# Patient Record
Sex: Female | Born: 1940 | Race: White | Hispanic: No | Smoking: Never smoker
Health system: Southern US, Community
[De-identification: ages and names within clinical notes are randomized; demographics above are authoritative.]

## PROBLEM LIST (undated history)

## (undated) DIAGNOSIS — D649 Anemia, unspecified: Secondary | ICD-10-CM

## (undated) DIAGNOSIS — I10 Essential (primary) hypertension: Secondary | ICD-10-CM

## (undated) DIAGNOSIS — F0390 Unspecified dementia without behavioral disturbance: Secondary | ICD-10-CM

## (undated) HISTORY — PX: VAGINAL HYSTERECTOMY: SHX2639

## (undated) HISTORY — PX: ABDOMINAL HYSTERECTOMY: SHX81

---

## 2016-06-10 ENCOUNTER — Other Ambulatory Visit: Payer: Self-pay | Admitting: Neurology

## 2016-06-10 DIAGNOSIS — R413 Other amnesia: Secondary | ICD-10-CM

## 2016-06-24 ENCOUNTER — Ambulatory Visit
Admission: RE | Admit: 2016-06-24 | Discharge: 2016-06-24 | Disposition: A | Discharge status: Home / Self Care | Payer: Medicare HMO | Source: Ambulatory Visit | Attending: Neurology | Admitting: Neurology

## 2016-06-24 ENCOUNTER — Encounter: Payer: Self-pay | Admitting: Radiology

## 2016-06-24 DIAGNOSIS — R9082 White matter disease, unspecified: Secondary | ICD-10-CM | POA: Insufficient documentation

## 2016-06-24 DIAGNOSIS — I517 Cardiomegaly: Secondary | ICD-10-CM | POA: Insufficient documentation

## 2016-06-24 DIAGNOSIS — R413 Other amnesia: Secondary | ICD-10-CM | POA: Insufficient documentation

## 2018-09-03 IMAGING — MR MR HEAD W/O CM
8 series · 48 of 48 positions shown · non-contrast
Comparison: None.

CLINICAL DATA: Memory loss.

EXAM:
MRI HEAD WITHOUT CONTRAST
TECHNIQUE: Multiplanar, multiecho pulse sequences of the brain and surrounding
structures were obtained without intravenous contrast.

[Series 2: T1 · sagittal · 5.0mm · 0.45mm/px · 3 of 23 slices shown (1 of 2)]
[im 1/23]
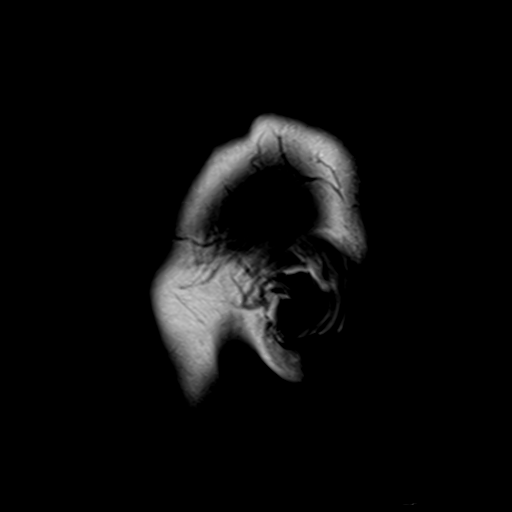
[im 12/23]
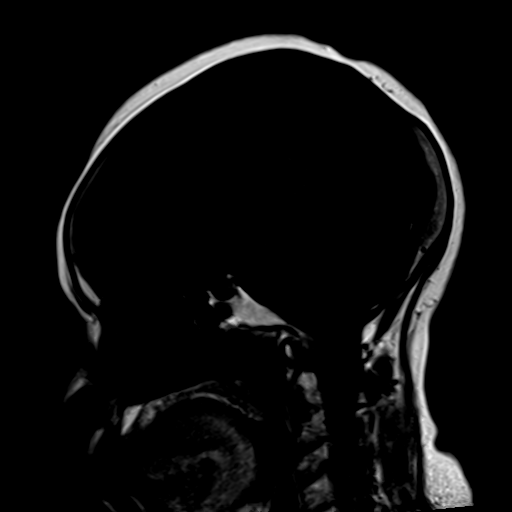
[im 23/23]
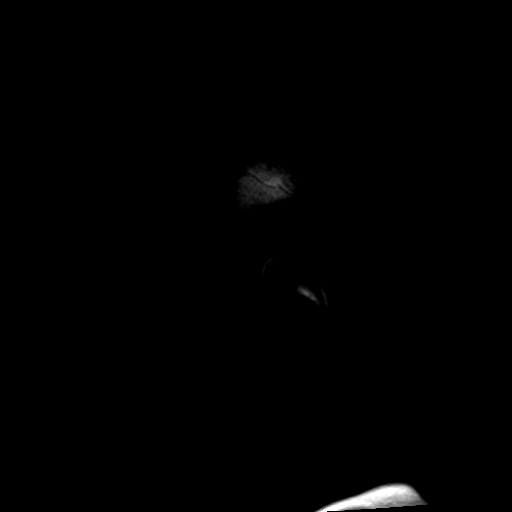

[Series 4: DWI · axial · 3.0mm · 1.20mm/px · z∈[-71,+87]mm · 6 of 55 slices shown (1 of 2)]
[im 1/55]
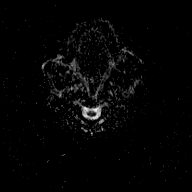
[im 11/55]
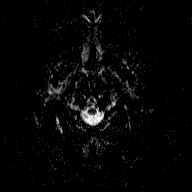
[im 22/55]
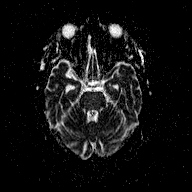
[im 33/55]
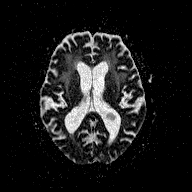
[im 44/55]
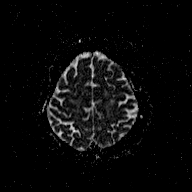
[im 55/55]
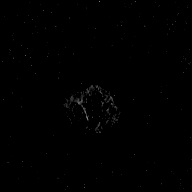

[Series 5: T2 · axial · 5.0mm · 0.72mm/px · z∈[-67,+83]mm · 3 of 23 slices shown (1 of 3)]
[im 1/23]
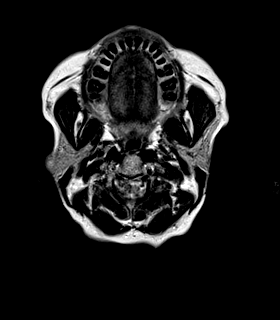
[im 12/23]
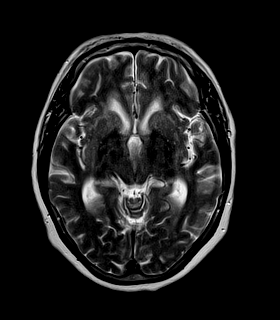
[im 23/23]
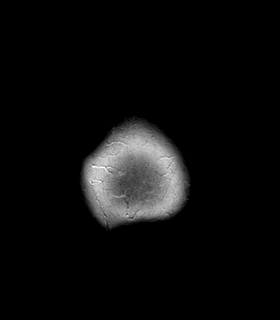

[Series 6: FLAIR · axial · 3.0mm · 0.45mm/px · z∈[-71,+87]mm · 6 of 55 slices shown]
[im 1/55]
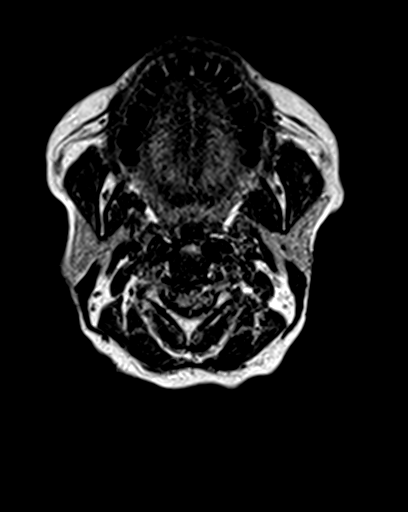
[im 11/55]
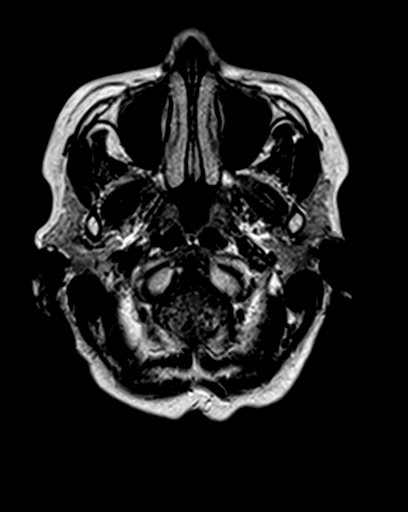
[im 22/55]
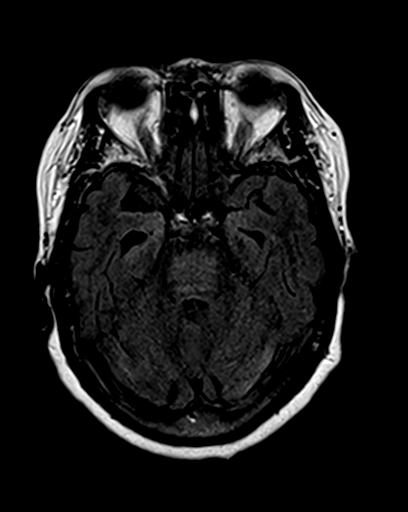
[im 33/55]
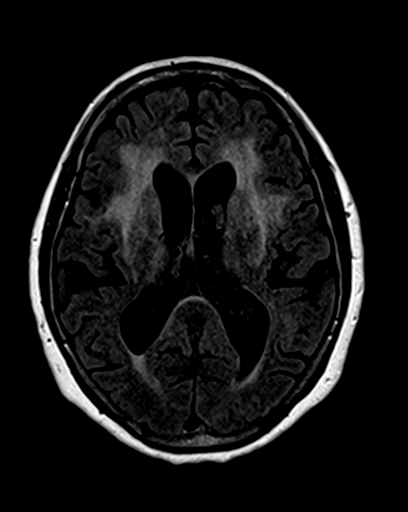
[im 44/55]
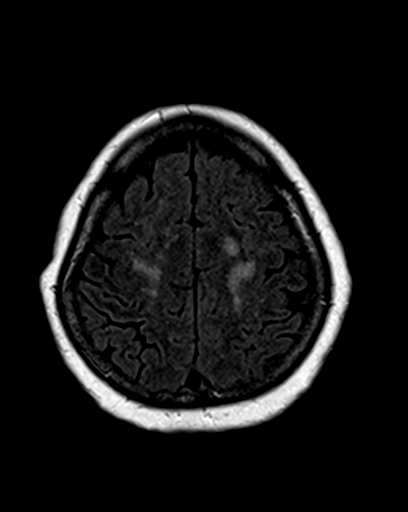
[im 55/55]
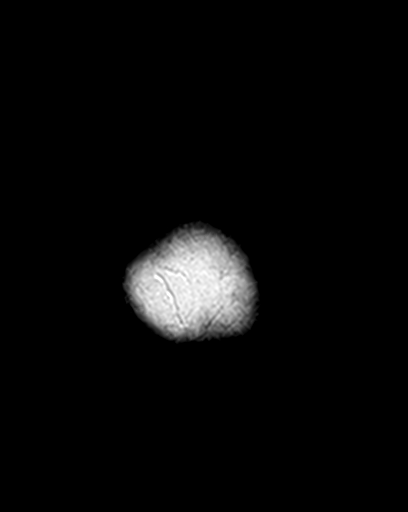

[Series 7: T2 · axial · 5.0mm · 0.72mm/px · z∈[-67,+83]mm · 3 of 23 slices shown (2 of 3)]
[im 1/23]
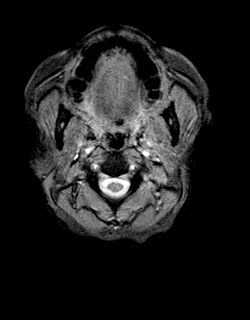
[im 12/23]
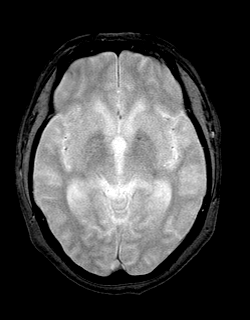
[im 23/23]
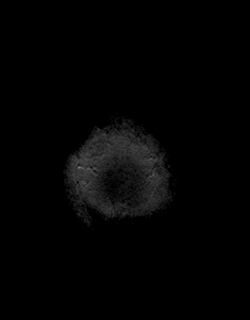

[Series 8: T1 · axial · 1.0mm · 1.00mm/px · z∈[-72,+82]mm · 18 of 160 slices shown (2 of 2)]
[im 1/160]
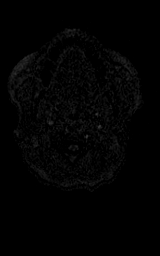
[im 10/160]
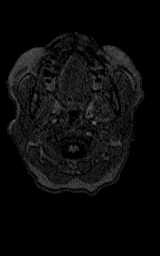
[im 19/160]
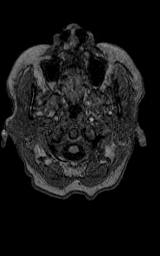
[im 29/160]
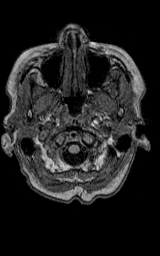
[im 38/160]
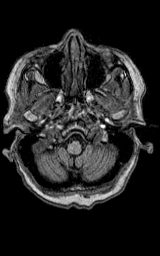
[im 47/160]
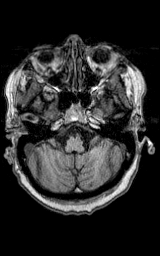
[im 57/160]
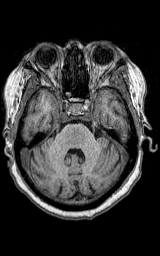
[im 66/160]
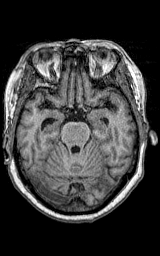
[im 75/160]
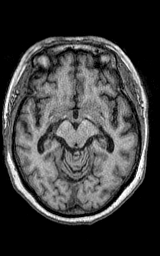
[im 85/160]
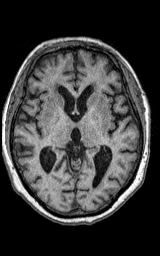
[im 94/160]
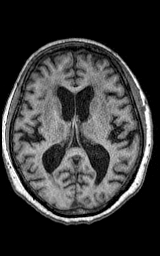
[im 103/160]
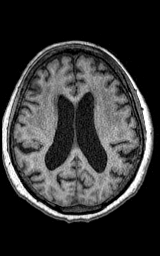
[im 113/160]
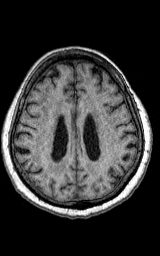
[im 122/160]
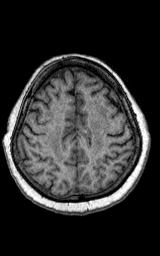
[im 131/160]
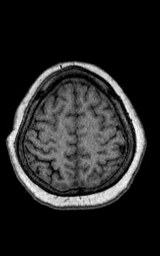
[im 141/160]
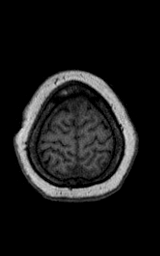
[im 150/160]
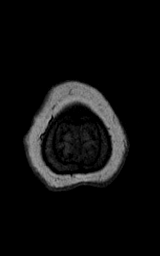
[im 160/160]
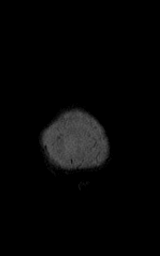

[Series 9: T2 · coronal · 5.0mm · 0.45mm/px · 3 of 29 slices shown (3 of 3)]
[im 1/29]
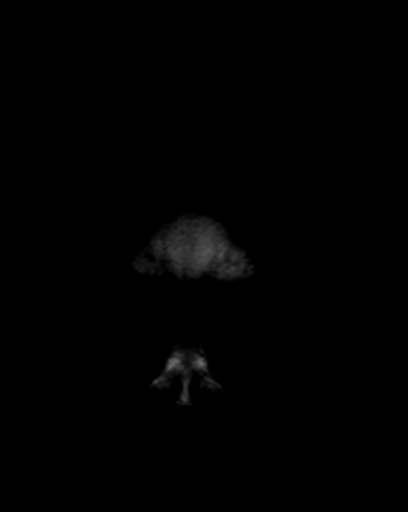
[im 15/29]
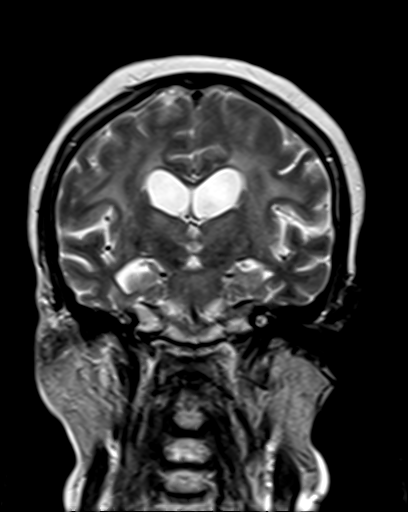
[im 29/29]
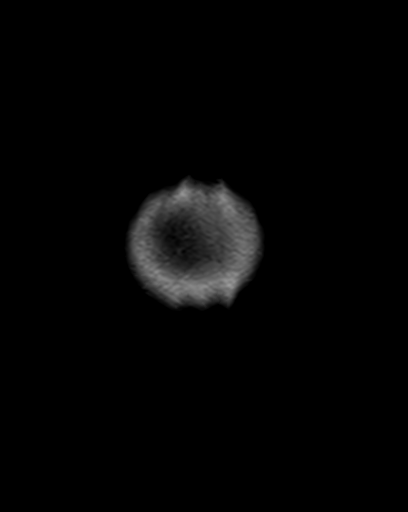

[Series 100: DWI · axial · 3.0mm · 1.20mm/px · z∈[-71,+87]mm · 6 of 55 slices shown (2 of 2)]
[im 1/55]
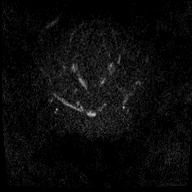
[im 11/55]
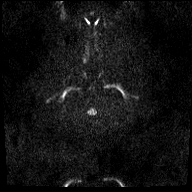
[im 22/55]
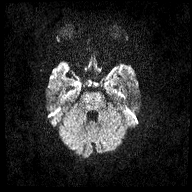
[im 33/55]
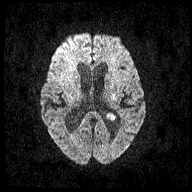
[im 44/55]
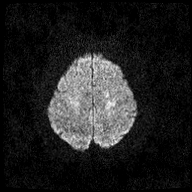
[im 55/55]
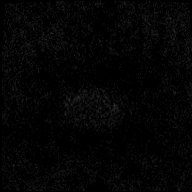

[48 of 48 positions shown; findings below may reference images not displayed]

FINDINGS: Brain: Mild ventricular enlargement. Mild subarachnoid enlargement.
Findings most consistent with atrophy however there could be an
element of hydrocephalus. Temporal lobes are mildly enlarged.

Extensive diffuse white matter hyperintensity throughout both
cerebral hemispheres. Extensive hyperintensity throughout the pons
bilaterally.

Negative for acute infarct. Negative for hemorrhage or mass or fluid
collection. No shift of the midline structures.

Vascular: Normal arterial flow void

Skull and upper cervical spine: Negative

Sinuses/Orbits: Negative

Other: None
IMPRESSION: Mild to moderate ventricular enlargement. This is likely due to
atrophy however normal pressure hydrocephalus is possible.

Extensive diffuse cerebral white matter hyperintensity. Extensive
hyperintensity in the pons. Differential diagnosis includes advanced
chronic microvascular ischemia. Correlate with risk factors. Toxic
or metabolic leukoencephalopathy is another consideration.

## 2019-02-18 ENCOUNTER — Ambulatory Visit: Payer: Medicare HMO | Admitting: Urology

## 2020-09-08 DIAGNOSIS — E119 Type 2 diabetes mellitus without complications: Secondary | ICD-10-CM | POA: Diagnosis not present

## 2020-09-10 DIAGNOSIS — G301 Alzheimer's disease with late onset: Secondary | ICD-10-CM | POA: Diagnosis not present

## 2020-09-10 DIAGNOSIS — F331 Major depressive disorder, recurrent, moderate: Secondary | ICD-10-CM | POA: Diagnosis not present

## 2020-09-10 DIAGNOSIS — F0281 Dementia in other diseases classified elsewhere with behavioral disturbance: Secondary | ICD-10-CM | POA: Diagnosis not present

## 2020-09-15 DIAGNOSIS — D649 Anemia, unspecified: Secondary | ICD-10-CM | POA: Diagnosis not present

## 2020-09-15 DIAGNOSIS — I10 Essential (primary) hypertension: Secondary | ICD-10-CM | POA: Diagnosis not present

## 2020-09-15 DIAGNOSIS — E119 Type 2 diabetes mellitus without complications: Secondary | ICD-10-CM | POA: Diagnosis not present

## 2020-09-15 DIAGNOSIS — F0280 Dementia in other diseases classified elsewhere without behavioral disturbance: Secondary | ICD-10-CM | POA: Diagnosis not present

## 2020-09-15 DIAGNOSIS — G309 Alzheimer's disease, unspecified: Secondary | ICD-10-CM | POA: Diagnosis not present

## 2020-09-15 DIAGNOSIS — F0150 Vascular dementia without behavioral disturbance: Secondary | ICD-10-CM | POA: Diagnosis not present

## 2020-09-15 DIAGNOSIS — E785 Hyperlipidemia, unspecified: Secondary | ICD-10-CM | POA: Diagnosis not present

## 2020-09-15 DIAGNOSIS — F331 Major depressive disorder, recurrent, moderate: Secondary | ICD-10-CM | POA: Diagnosis not present

## 2020-09-21 DIAGNOSIS — N289 Disorder of kidney and ureter, unspecified: Secondary | ICD-10-CM | POA: Diagnosis not present

## 2020-09-21 DIAGNOSIS — D509 Iron deficiency anemia, unspecified: Secondary | ICD-10-CM | POA: Diagnosis not present

## 2020-09-21 DIAGNOSIS — Z79899 Other long term (current) drug therapy: Secondary | ICD-10-CM | POA: Diagnosis not present

## 2020-09-21 DIAGNOSIS — D7589 Other specified diseases of blood and blood-forming organs: Secondary | ICD-10-CM | POA: Diagnosis not present

## 2020-09-21 DIAGNOSIS — I10 Essential (primary) hypertension: Secondary | ICD-10-CM | POA: Diagnosis not present

## 2020-09-21 DIAGNOSIS — F0281 Dementia in other diseases classified elsewhere with behavioral disturbance: Secondary | ICD-10-CM | POA: Diagnosis not present

## 2020-09-21 DIAGNOSIS — G308 Other Alzheimer's disease: Secondary | ICD-10-CM | POA: Diagnosis not present

## 2020-10-08 DIAGNOSIS — F331 Major depressive disorder, recurrent, moderate: Secondary | ICD-10-CM | POA: Diagnosis not present

## 2020-10-08 DIAGNOSIS — F0281 Dementia in other diseases classified elsewhere with behavioral disturbance: Secondary | ICD-10-CM | POA: Diagnosis not present

## 2020-10-08 DIAGNOSIS — G301 Alzheimer's disease with late onset: Secondary | ICD-10-CM | POA: Diagnosis not present

## 2020-10-12 DIAGNOSIS — E1165 Type 2 diabetes mellitus with hyperglycemia: Secondary | ICD-10-CM | POA: Diagnosis not present

## 2020-10-12 DIAGNOSIS — G308 Other Alzheimer's disease: Secondary | ICD-10-CM | POA: Diagnosis not present

## 2020-10-12 DIAGNOSIS — F0281 Dementia in other diseases classified elsewhere with behavioral disturbance: Secondary | ICD-10-CM | POA: Diagnosis not present

## 2020-10-12 DIAGNOSIS — J301 Allergic rhinitis due to pollen: Secondary | ICD-10-CM | POA: Diagnosis not present

## 2020-10-12 DIAGNOSIS — D509 Iron deficiency anemia, unspecified: Secondary | ICD-10-CM | POA: Diagnosis not present

## 2020-10-15 DIAGNOSIS — Z79899 Other long term (current) drug therapy: Secondary | ICD-10-CM | POA: Diagnosis not present

## 2020-10-15 DIAGNOSIS — N390 Urinary tract infection, site not specified: Secondary | ICD-10-CM | POA: Diagnosis not present

## 2020-11-02 DIAGNOSIS — G301 Alzheimer's disease with late onset: Secondary | ICD-10-CM | POA: Diagnosis not present

## 2020-11-02 DIAGNOSIS — F331 Major depressive disorder, recurrent, moderate: Secondary | ICD-10-CM | POA: Diagnosis not present

## 2020-11-02 DIAGNOSIS — F03918 Unspecified dementia, unspecified severity, with other behavioral disturbance: Secondary | ICD-10-CM | POA: Diagnosis not present

## 2020-11-03 DIAGNOSIS — N390 Urinary tract infection, site not specified: Secondary | ICD-10-CM | POA: Diagnosis not present

## 2020-11-03 DIAGNOSIS — Z79899 Other long term (current) drug therapy: Secondary | ICD-10-CM | POA: Diagnosis not present

## 2020-11-09 DIAGNOSIS — E119 Type 2 diabetes mellitus without complications: Secondary | ICD-10-CM | POA: Diagnosis not present

## 2020-11-09 DIAGNOSIS — F03918 Unspecified dementia, unspecified severity, with other behavioral disturbance: Secondary | ICD-10-CM | POA: Diagnosis not present

## 2020-11-09 DIAGNOSIS — G308 Other Alzheimer's disease: Secondary | ICD-10-CM | POA: Diagnosis not present

## 2020-11-09 DIAGNOSIS — M1990 Unspecified osteoarthritis, unspecified site: Secondary | ICD-10-CM | POA: Diagnosis not present

## 2020-11-09 DIAGNOSIS — I10 Essential (primary) hypertension: Secondary | ICD-10-CM | POA: Diagnosis not present

## 2020-11-16 DIAGNOSIS — F02B18 Dementia in other diseases classified elsewhere, moderate, with other behavioral disturbance: Secondary | ICD-10-CM | POA: Diagnosis not present

## 2020-11-16 DIAGNOSIS — F331 Major depressive disorder, recurrent, moderate: Secondary | ICD-10-CM | POA: Diagnosis not present

## 2020-11-16 DIAGNOSIS — F5105 Insomnia due to other mental disorder: Secondary | ICD-10-CM | POA: Diagnosis not present

## 2020-11-16 DIAGNOSIS — G301 Alzheimer's disease with late onset: Secondary | ICD-10-CM | POA: Diagnosis not present

## 2020-11-24 DIAGNOSIS — G301 Alzheimer's disease with late onset: Secondary | ICD-10-CM | POA: Diagnosis not present

## 2020-11-24 DIAGNOSIS — I10 Essential (primary) hypertension: Secondary | ICD-10-CM | POA: Diagnosis not present

## 2020-11-24 DIAGNOSIS — E118 Type 2 diabetes mellitus with unspecified complications: Secondary | ICD-10-CM | POA: Diagnosis not present

## 2020-11-24 DIAGNOSIS — F331 Major depressive disorder, recurrent, moderate: Secondary | ICD-10-CM | POA: Diagnosis not present

## 2020-11-25 DIAGNOSIS — L603 Nail dystrophy: Secondary | ICD-10-CM | POA: Diagnosis not present

## 2020-11-25 DIAGNOSIS — E118 Type 2 diabetes mellitus with unspecified complications: Secondary | ICD-10-CM | POA: Diagnosis not present

## 2020-12-07 DIAGNOSIS — F331 Major depressive disorder, recurrent, moderate: Secondary | ICD-10-CM | POA: Diagnosis not present

## 2020-12-07 DIAGNOSIS — F02B18 Dementia in other diseases classified elsewhere, moderate, with other behavioral disturbance: Secondary | ICD-10-CM | POA: Diagnosis not present

## 2020-12-07 DIAGNOSIS — E118 Type 2 diabetes mellitus with unspecified complications: Secondary | ICD-10-CM | POA: Diagnosis not present

## 2020-12-07 DIAGNOSIS — G301 Alzheimer's disease with late onset: Secondary | ICD-10-CM | POA: Diagnosis not present

## 2020-12-07 DIAGNOSIS — R159 Full incontinence of feces: Secondary | ICD-10-CM | POA: Diagnosis not present

## 2020-12-07 DIAGNOSIS — N3942 Incontinence without sensory awareness: Secondary | ICD-10-CM | POA: Diagnosis not present

## 2020-12-10 DIAGNOSIS — Z79899 Other long term (current) drug therapy: Secondary | ICD-10-CM | POA: Diagnosis not present

## 2020-12-10 DIAGNOSIS — N390 Urinary tract infection, site not specified: Secondary | ICD-10-CM | POA: Diagnosis not present

## 2020-12-10 DIAGNOSIS — Z20828 Contact with and (suspected) exposure to other viral communicable diseases: Secondary | ICD-10-CM | POA: Diagnosis not present

## 2020-12-10 DIAGNOSIS — J111 Influenza due to unidentified influenza virus with other respiratory manifestations: Secondary | ICD-10-CM | POA: Diagnosis not present

## 2020-12-21 DIAGNOSIS — M25572 Pain in left ankle and joints of left foot: Secondary | ICD-10-CM | POA: Diagnosis not present

## 2020-12-21 DIAGNOSIS — R2242 Localized swelling, mass and lump, left lower limb: Secondary | ICD-10-CM | POA: Diagnosis not present

## 2020-12-28 DIAGNOSIS — F02B18 Dementia in other diseases classified elsewhere, moderate, with other behavioral disturbance: Secondary | ICD-10-CM | POA: Diagnosis not present

## 2020-12-28 DIAGNOSIS — G301 Alzheimer's disease with late onset: Secondary | ICD-10-CM | POA: Diagnosis not present

## 2020-12-28 DIAGNOSIS — F22 Delusional disorders: Secondary | ICD-10-CM | POA: Diagnosis not present

## 2020-12-28 DIAGNOSIS — F331 Major depressive disorder, recurrent, moderate: Secondary | ICD-10-CM | POA: Diagnosis not present

## 2020-12-31 DIAGNOSIS — W19XXXA Unspecified fall, initial encounter: Secondary | ICD-10-CM | POA: Diagnosis not present

## 2020-12-31 DIAGNOSIS — R451 Restlessness and agitation: Secondary | ICD-10-CM | POA: Diagnosis not present

## 2021-01-01 DIAGNOSIS — M25562 Pain in left knee: Secondary | ICD-10-CM | POA: Diagnosis not present

## 2021-01-01 DIAGNOSIS — F0150 Vascular dementia without behavioral disturbance: Secondary | ICD-10-CM | POA: Diagnosis not present

## 2021-01-01 DIAGNOSIS — F0280 Dementia in other diseases classified elsewhere without behavioral disturbance: Secondary | ICD-10-CM | POA: Diagnosis not present

## 2021-01-01 DIAGNOSIS — G309 Alzheimer's disease, unspecified: Secondary | ICD-10-CM | POA: Diagnosis not present

## 2021-01-01 DIAGNOSIS — I10 Essential (primary) hypertension: Secondary | ICD-10-CM | POA: Diagnosis not present

## 2021-01-01 DIAGNOSIS — E119 Type 2 diabetes mellitus without complications: Secondary | ICD-10-CM | POA: Diagnosis not present

## 2021-01-01 DIAGNOSIS — M25552 Pain in left hip: Secondary | ICD-10-CM | POA: Diagnosis not present

## 2021-01-04 DIAGNOSIS — F0150 Vascular dementia without behavioral disturbance: Secondary | ICD-10-CM | POA: Diagnosis not present

## 2021-01-04 DIAGNOSIS — I10 Essential (primary) hypertension: Secondary | ICD-10-CM | POA: Diagnosis not present

## 2021-01-04 DIAGNOSIS — M25562 Pain in left knee: Secondary | ICD-10-CM | POA: Diagnosis not present

## 2021-01-04 DIAGNOSIS — Z79899 Other long term (current) drug therapy: Secondary | ICD-10-CM | POA: Diagnosis not present

## 2021-01-04 DIAGNOSIS — M25552 Pain in left hip: Secondary | ICD-10-CM | POA: Diagnosis not present

## 2021-01-04 DIAGNOSIS — F0280 Dementia in other diseases classified elsewhere without behavioral disturbance: Secondary | ICD-10-CM | POA: Diagnosis not present

## 2021-01-04 DIAGNOSIS — G309 Alzheimer's disease, unspecified: Secondary | ICD-10-CM | POA: Diagnosis not present

## 2021-01-04 DIAGNOSIS — E119 Type 2 diabetes mellitus without complications: Secondary | ICD-10-CM | POA: Diagnosis not present

## 2021-01-14 DIAGNOSIS — G309 Alzheimer's disease, unspecified: Secondary | ICD-10-CM | POA: Diagnosis not present

## 2021-01-14 DIAGNOSIS — F0280 Dementia in other diseases classified elsewhere without behavioral disturbance: Secondary | ICD-10-CM | POA: Diagnosis not present

## 2021-01-14 DIAGNOSIS — N390 Urinary tract infection, site not specified: Secondary | ICD-10-CM | POA: Diagnosis not present

## 2021-01-14 DIAGNOSIS — F0150 Vascular dementia without behavioral disturbance: Secondary | ICD-10-CM | POA: Diagnosis not present

## 2021-01-18 DIAGNOSIS — G301 Alzheimer's disease with late onset: Secondary | ICD-10-CM | POA: Diagnosis not present

## 2021-01-18 DIAGNOSIS — F331 Major depressive disorder, recurrent, moderate: Secondary | ICD-10-CM | POA: Diagnosis not present

## 2021-01-18 DIAGNOSIS — R451 Restlessness and agitation: Secondary | ICD-10-CM | POA: Diagnosis not present

## 2021-01-18 DIAGNOSIS — F22 Delusional disorders: Secondary | ICD-10-CM | POA: Diagnosis not present

## 2021-01-18 DIAGNOSIS — F02B18 Dementia in other diseases classified elsewhere, moderate, with other behavioral disturbance: Secondary | ICD-10-CM | POA: Diagnosis not present

## 2021-01-22 DIAGNOSIS — F22 Delusional disorders: Secondary | ICD-10-CM | POA: Diagnosis not present

## 2021-01-22 DIAGNOSIS — I10 Essential (primary) hypertension: Secondary | ICD-10-CM | POA: Diagnosis not present

## 2021-01-28 DIAGNOSIS — E785 Hyperlipidemia, unspecified: Secondary | ICD-10-CM | POA: Diagnosis not present

## 2021-01-28 DIAGNOSIS — D649 Anemia, unspecified: Secondary | ICD-10-CM | POA: Diagnosis not present

## 2021-02-12 DIAGNOSIS — F22 Delusional disorders: Secondary | ICD-10-CM | POA: Diagnosis not present

## 2021-02-12 DIAGNOSIS — E1159 Type 2 diabetes mellitus with other circulatory complications: Secondary | ICD-10-CM | POA: Diagnosis not present

## 2021-02-15 DIAGNOSIS — F02B18 Dementia in other diseases classified elsewhere, moderate, with other behavioral disturbance: Secondary | ICD-10-CM | POA: Diagnosis not present

## 2021-02-15 DIAGNOSIS — R5383 Other fatigue: Secondary | ICD-10-CM | POA: Diagnosis not present

## 2021-02-15 DIAGNOSIS — G301 Alzheimer's disease with late onset: Secondary | ICD-10-CM | POA: Diagnosis not present

## 2021-02-15 DIAGNOSIS — F331 Major depressive disorder, recurrent, moderate: Secondary | ICD-10-CM | POA: Diagnosis not present

## 2021-02-15 DIAGNOSIS — F22 Delusional disorders: Secondary | ICD-10-CM | POA: Diagnosis not present

## 2021-02-19 DIAGNOSIS — Z79899 Other long term (current) drug therapy: Secondary | ICD-10-CM | POA: Diagnosis not present

## 2021-02-19 DIAGNOSIS — N390 Urinary tract infection, site not specified: Secondary | ICD-10-CM | POA: Diagnosis not present

## 2021-03-08 DIAGNOSIS — L603 Nail dystrophy: Secondary | ICD-10-CM | POA: Diagnosis not present

## 2021-03-08 DIAGNOSIS — E1159 Type 2 diabetes mellitus with other circulatory complications: Secondary | ICD-10-CM | POA: Diagnosis not present

## 2021-03-17 DIAGNOSIS — M4316 Spondylolisthesis, lumbar region: Secondary | ICD-10-CM | POA: Diagnosis not present

## 2021-03-17 DIAGNOSIS — L89152 Pressure ulcer of sacral region, stage 2: Secondary | ICD-10-CM | POA: Diagnosis not present

## 2021-03-17 DIAGNOSIS — M545 Low back pain, unspecified: Secondary | ICD-10-CM | POA: Diagnosis not present

## 2021-03-26 DIAGNOSIS — F22 Delusional disorders: Secondary | ICD-10-CM | POA: Diagnosis not present

## 2021-03-26 DIAGNOSIS — E1159 Type 2 diabetes mellitus with other circulatory complications: Secondary | ICD-10-CM | POA: Diagnosis not present

## 2021-04-16 DIAGNOSIS — F0150 Vascular dementia without behavioral disturbance: Secondary | ICD-10-CM | POA: Diagnosis not present

## 2021-04-16 DIAGNOSIS — Z1389 Encounter for screening for other disorder: Secondary | ICD-10-CM | POA: Diagnosis not present

## 2021-04-16 DIAGNOSIS — E119 Type 2 diabetes mellitus without complications: Secondary | ICD-10-CM | POA: Diagnosis not present

## 2021-04-16 DIAGNOSIS — I10 Essential (primary) hypertension: Secondary | ICD-10-CM | POA: Diagnosis not present

## 2021-04-16 DIAGNOSIS — F331 Major depressive disorder, recurrent, moderate: Secondary | ICD-10-CM | POA: Diagnosis not present

## 2021-04-16 DIAGNOSIS — G309 Alzheimer's disease, unspecified: Secondary | ICD-10-CM | POA: Diagnosis not present

## 2021-04-16 DIAGNOSIS — F0280 Dementia in other diseases classified elsewhere without behavioral disturbance: Secondary | ICD-10-CM | POA: Diagnosis not present

## 2021-04-16 DIAGNOSIS — Z0000 Encounter for general adult medical examination without abnormal findings: Secondary | ICD-10-CM | POA: Diagnosis not present

## 2021-05-21 DIAGNOSIS — I10 Essential (primary) hypertension: Secondary | ICD-10-CM | POA: Diagnosis not present

## 2021-05-21 DIAGNOSIS — F0390 Unspecified dementia without behavioral disturbance: Secondary | ICD-10-CM | POA: Diagnosis not present

## 2021-08-09 ENCOUNTER — Emergency Department
Admission: EM | Admit: 2021-08-09 | Discharge: 2021-08-09 | Disposition: A | Discharge status: Home / Self Care | Payer: Medicare HMO | Attending: Student in an Organized Health Care Education/Training Program | Admitting: Student in an Organized Health Care Education/Training Program

## 2021-08-09 ENCOUNTER — Emergency Department: Payer: Medicare HMO

## 2021-08-09 ENCOUNTER — Other Ambulatory Visit: Payer: Self-pay

## 2021-08-09 DIAGNOSIS — R58 Hemorrhage, not elsewhere classified: Secondary | ICD-10-CM | POA: Diagnosis not present

## 2021-08-09 DIAGNOSIS — S0990XA Unspecified injury of head, initial encounter: Secondary | ICD-10-CM | POA: Diagnosis not present

## 2021-08-09 DIAGNOSIS — R404 Transient alteration of awareness: Secondary | ICD-10-CM | POA: Diagnosis not present

## 2021-08-09 DIAGNOSIS — R001 Bradycardia, unspecified: Secondary | ICD-10-CM | POA: Diagnosis not present

## 2021-08-09 DIAGNOSIS — Z743 Need for continuous supervision: Secondary | ICD-10-CM | POA: Diagnosis not present

## 2021-08-09 DIAGNOSIS — W19XXXA Unspecified fall, initial encounter: Secondary | ICD-10-CM | POA: Insufficient documentation

## 2021-08-09 DIAGNOSIS — R410 Disorientation, unspecified: Secondary | ICD-10-CM | POA: Diagnosis not present

## 2021-08-09 DIAGNOSIS — F0390 Unspecified dementia without behavioral disturbance: Secondary | ICD-10-CM | POA: Diagnosis not present

## 2021-08-09 DIAGNOSIS — G9389 Other specified disorders of brain: Secondary | ICD-10-CM | POA: Diagnosis not present

## 2021-08-09 DIAGNOSIS — Z23 Encounter for immunization: Secondary | ICD-10-CM | POA: Insufficient documentation

## 2021-08-09 DIAGNOSIS — R0902 Hypoxemia: Secondary | ICD-10-CM | POA: Diagnosis not present

## 2021-08-09 DIAGNOSIS — M50321 Other cervical disc degeneration at C4-C5 level: Secondary | ICD-10-CM | POA: Diagnosis not present

## 2021-08-09 DIAGNOSIS — S0081XA Abrasion of other part of head, initial encounter: Secondary | ICD-10-CM | POA: Insufficient documentation

## 2021-08-09 DIAGNOSIS — G319 Degenerative disease of nervous system, unspecified: Secondary | ICD-10-CM | POA: Diagnosis not present

## 2021-08-09 DIAGNOSIS — I10 Essential (primary) hypertension: Secondary | ICD-10-CM | POA: Diagnosis not present

## 2021-08-09 HISTORY — DX: Essential (primary) hypertension: I10

## 2021-08-09 HISTORY — DX: Anemia, unspecified: D64.9

## 2021-08-09 MED ORDER — TETANUS-DIPHTH-ACELL PERTUSSIS 5-2.5-18.5 LF-MCG/0.5 IM SUSY
0.5000 mL | PREFILLED_SYRINGE | Freq: Once | INTRAMUSCULAR | Status: AC
  Administered 2021-08-09: 0.5 mL via INTRAMUSCULAR
  Filled 2021-08-09: qty 0.5

## 2021-08-09 NOTE — ED Triage Notes (Signed)
Pt comes via EMS from [facility] with c/o laceration above right eye and had witnessed fall. Pt has dementia and at baseline.

## 2021-08-09 NOTE — ED Triage Notes (Signed)
Pt comes into the ED via EMS [facility], witnessed fall, lac above the right eye with controlled bleeding, baseline on arrival, hx of dementia, hx of biting,spitting and hitting.  129/101 HR63-70 94%RA

## 2021-08-09 NOTE — ED Provider Notes (Signed)
[facility] Provider Note  Patient Contact: 3:03 PM (approximate)   History   Fall and Laceration   HPI  Anna Rubio is a 81 y.o. female who presents to the emergency department complaining of fall with eyebrow abrasion/laceration.  Patient has a history of dementia, cannot provide much details.  Majority of information is provided by EMS who brings the patient to the ED.  Reportedly she is from [facility], had a witnessed fall striking her head.  There is no reported loss of consciousness.  According to EMS staff at [facility] house reports that the patient is at her baseline.     Physical Exam   Triage Vital Signs: ED Triage Vitals [08/09/21 1433]  Enc Vitals Group     BP (!) 174/124     Pulse Rate 63     Resp 18     Temp 98.5 F (36.9 C)     Temp src      SpO2 100 %     Weight      Height      Head Circumference      Peak Flow      Pain Score      Pain Loc      Pain Edu?      Excl. in GC?     Most recent vital signs: Vitals:   08/09/21 1433  BP: (!) 174/124  Pulse: 63  Resp: 18  Temp: 98.5 F (36.9 C)  SpO2: 100%     General: Alert and in no acute distress.  Patient is pleasant but confused, unable to provide majority of history. Eyes:  PERRL. EOMI. Head: Abrasion noted to the right forehead.  This measures approximately 1.5 cm in length.  Approximately 1 cm in width.  Again this is superficial with no evidence of retained foreign body.  No active bleeding.  There is no periorbital edema, ecchymosis or tenderness along the orbit.  No battle signs, raccoon eyes, serosanguineous fluid drainage from the ears or nares.  Neck: No stridor. No cervical spine tenderness to palpation.  Cardiovascular:  Good peripheral perfusion Respiratory: Normal respiratory effort without tachypnea or retractions. Lungs CTAB. Good air entry to the bases with no decreased or absent breath sounds. Musculoskeletal: Full range of motion to all  extremities.  No visible signs of trauma to the extremities.  Patient is moving all extremities at this time.  Palpation does not seem to elicit any tenderness to any of the 4 extremities.  Pulses intact all 4 extremities. Neurologic:  No gross focal neurologic deficits are appreciated.  Patient with dementia.  Patient has extreme difficulty following commands.  There does not appear to be any gross neurodeficits on exam. Skin:   No rash noted Other:   ED Results / Procedures / Treatments   Labs (all labs ordered are listed, but only abnormal results are displayed) Labs Reviewed - No data to display   EKG     RADIOLOGY  I personally viewed, evaluated, and interpreted these images as part of my medical decision making, as well as reviewing the written report by the radiologist.  ED Provider Interpretation: CT scan of the head and cervical spine reveals no evidence of intracranial hemorrhage, skull fracture, cervical spine fracture.  Patient does have soft tissue edema consistent with abrasion noted over the right eye.  CT Cervical Spine Wo Contrast  Result Date: 08/09/2021 CLINICAL DATA:  Neck trauma (Age >= 65y) Dementia patient post witnessed fall. EXAM: CT CERVICAL  SPINE WITHOUT CONTRAST TECHNIQUE: Multidetector CT imaging of the cervical spine was performed without intravenous contrast. Multiplanar CT image reconstructions were also generated. RADIATION DOSE REDUCTION: This exam was performed according to the departmental dose-optimization program which includes automated exposure control, adjustment of the mA and/or kV according to patient size and/or use of iterative reconstruction technique. COMPARISON:  None Available. FINDINGS: Alignment: Straightening of normal lordosis. No traumatic subluxation. Trace retrolisthesis of C4 on C5 appears degenerative. Skull base and vertebrae: No acute fracture. Non fusion of the posterior arch of C1, variant anatomy. Intact dens and skull base. No  acceptable condylar fracture. Soft tissues and spinal canal: No prevertebral fluid or swelling. No visible canal hematoma. Disc levels: Degenerative disc disease at C4-C5 with Modic endplate changes. Additional level multilevel degenerative disc disease. There is mild multilevel facet hypertrophy. Upper chest: Right apical pleuroparenchymal scarring. No acute findings. Other: None. IMPRESSION: 1. No acute fracture or subluxation of the cervical spine. 2. Multilevel degenerative disc disease and facet hypertrophy. Electronically Signed   By: Narda Rutherford M.D.   On: 08/09/2021 16:03   CT Head Wo Contrast  Result Date: 08/09/2021 CLINICAL DATA:  Head trauma, moderate-severe Dementia patient post witnessed fall, laceration above right eye. EXAM: CT HEAD WITHOUT CONTRAST TECHNIQUE: Contiguous axial images were obtained from the base of the skull through the vertex without intravenous contrast. RADIATION DOSE REDUCTION: This exam was performed according to the departmental dose-optimization program which includes automated exposure control, adjustment of the mA and/or kV according to patient size and/or use of iterative reconstruction technique. COMPARISON:  Brain MRI 06/24/2016 FINDINGS: Brain: Stable brain volume with ventricular enlargement and atrophy. Similar rounding of the temporal horns. No intracranial hemorrhage, mass effect, or midline shift. The basilar cisterns are patent. Advanced periventricular chronic small vessel ischemic change. No evidence of territorial infarct or acute ischemia. No extra-axial or intracranial fluid collection. Vascular: Atherosclerosis of skullbase vasculature without hyperdense vessel or abnormal calcification. Skull: No fracture or focal lesion. Sinuses/Orbits: No fracture of the included facial bones. No acute findings. No mastoid effusion. Other: Right periorbital soft tissue edema. IMPRESSION: 1. Right periorbital soft tissue edema. No acute intracranial abnormality. No  skull fracture. 2. Stable brain volume loss and periventricular changes typical of chronic small vessel ischemic change. Electronically Signed   By: Narda Rutherford M.D.   On: 08/09/2021 15:59    PROCEDURES:  Critical Care performed: No  Procedures   MEDICATIONS ORDERED IN ED: Medications  Tdap (BOOSTRIX) injection 0.5 mL (0.5 mLs Intramuscular Given 08/09/21 1617)     IMPRESSION / MDM / ASSESSMENT AND PLAN / ED COURSE  I reviewed the triage vital signs and the nursing notes.                              Differential diagnosis includes, but is not limited to, forehead abrasion, skull fracture, intracranial hemorrhage, cervical spine fracture.  Patient's presentation is most consistent with acute presentation with potential threat to life or bodily function.   Patient's diagnosis is consistent with fall, minor head injury, forehead abrasion.  Patient presented to the emergency department after a witnessed fall from long-term care facility.  Patient has dementia and did not provide much of her history.  According to EMS she had a mechanical fall, fell and hit her head.  No loss of consciousness.  There is no reported changes from the patient's baseline.  She was pleasant in the emergency department.  Denied any complaints in the emergency department.  Abrasion noted to the right forehead without any other significant signs of trauma.  Imaging with CT scan of the head and cervical spine were obtained.  Results reveal no acute intracranial or abnormality.  No cervical spine fracture...  Patient has an abrasion to the right eyebrow that does not need repair at this time.  Area was cleansed, covered with bandage.  According to the medical record patient's last tetanus shot was greater than 10 years ago and she cannot provide any further details so we will update the patient's tetanus shot.  Patient appears stable for discharge at this time.  Patient is given ED precautions to return to the ED for  any worsening or new symptoms.        FINAL CLINICAL IMPRESSION(S) / ED DIAGNOSES   Final diagnoses:  Fall, initial encounter  Injury of head, initial encounter  Abrasion of forehead, initial encounter     Rx / DC Orders   ED Discharge Orders     None        Note:  This document was prepared using Dragon voice recognition software and may include unintentional dictation errors.   Lanette Hampshire 08/09/21 1655    Willy Eddy, MD 08/09/21 2010

## 2021-08-09 NOTE — ED Provider Triage Note (Signed)
Emergency Medicine Provider Triage Evaluation Note  Anna Rubio , a 81 y.o. female  was evaluated in triage.  Pt complains of witnessed fall. Trip and fall. Struck her head. No LOC. No vomiting. No AC. Laceration above right eyebrow. H/o dementia but at baseline per EMS  Review of Systems  Positive: lac Negative: pain  Physical Exam  There were no vitals taken for this visit. Gen:   Awake, no distress   Resp:  Normal effort  MSK:   Moves extremities without difficulty  Other:  Lac above right eyebrow  Medical Decision Making  Medically screening exam initiated at 2:34 PM.  Appropriate orders placed.  Quinci Gavidia was informed that the remainder of the evaluation will be completed by another provider, this initial triage assessment does not replace that evaluation, and the importance of remaining in the ED until their evaluation is complete.     Jackelyn Hoehn, PA-C 08/09/21 1436

## 2021-08-17 DIAGNOSIS — F0150 Vascular dementia without behavioral disturbance: Secondary | ICD-10-CM | POA: Diagnosis not present

## 2021-08-17 DIAGNOSIS — F0280 Dementia in other diseases classified elsewhere without behavioral disturbance: Secondary | ICD-10-CM | POA: Diagnosis not present

## 2021-08-17 DIAGNOSIS — G309 Alzheimer's disease, unspecified: Secondary | ICD-10-CM | POA: Diagnosis not present

## 2021-08-17 DIAGNOSIS — Y92009 Unspecified place in unspecified non-institutional (private) residence as the place of occurrence of the external cause: Secondary | ICD-10-CM | POA: Diagnosis not present

## 2021-08-17 DIAGNOSIS — S0081XD Abrasion of other part of head, subsequent encounter: Secondary | ICD-10-CM | POA: Diagnosis not present

## 2021-08-17 DIAGNOSIS — W010XXA Fall on same level from slipping, tripping and stumbling without subsequent striking against object, initial encounter: Secondary | ICD-10-CM | POA: Diagnosis not present

## 2021-08-19 DIAGNOSIS — Z9181 History of falling: Secondary | ICD-10-CM | POA: Diagnosis not present

## 2021-08-19 DIAGNOSIS — G309 Alzheimer's disease, unspecified: Secondary | ICD-10-CM | POA: Diagnosis not present

## 2021-11-02 ENCOUNTER — Ambulatory Visit
Admission: RE | Admit: 2021-11-02 | Discharge: 2021-11-02 | Disposition: A | Discharge status: Home / Self Care | Payer: Medicare HMO | Source: Ambulatory Visit | Attending: Physician Assistant | Admitting: Physician Assistant

## 2021-11-02 ENCOUNTER — Other Ambulatory Visit: Payer: Self-pay | Admitting: Physician Assistant

## 2021-11-02 DIAGNOSIS — X58XXXA Exposure to other specified factors, initial encounter: Secondary | ICD-10-CM | POA: Insufficient documentation

## 2021-11-02 DIAGNOSIS — W19XXXA Unspecified fall, initial encounter: Secondary | ICD-10-CM | POA: Insufficient documentation

## 2021-11-02 DIAGNOSIS — S0990XA Unspecified injury of head, initial encounter: Secondary | ICD-10-CM | POA: Diagnosis not present

## 2022-05-26 ENCOUNTER — Encounter: Payer: Self-pay | Admitting: Emergency Medicine

## 2022-05-26 ENCOUNTER — Emergency Department: Payer: Medicare HMO

## 2022-05-26 ENCOUNTER — Emergency Department
Admission: EM | Admit: 2022-05-26 | Discharge: 2022-05-26 | Disposition: A | Discharge status: Skilled Nursing Facility | Payer: Medicare HMO | Attending: Emergency Medicine | Admitting: Emergency Medicine

## 2022-05-26 ENCOUNTER — Other Ambulatory Visit: Payer: Self-pay

## 2022-05-26 DIAGNOSIS — F0390 Unspecified dementia without behavioral disturbance: Secondary | ICD-10-CM | POA: Diagnosis not present

## 2022-05-26 DIAGNOSIS — W19XXXA Unspecified fall, initial encounter: Secondary | ICD-10-CM

## 2022-05-26 DIAGNOSIS — I10 Essential (primary) hypertension: Secondary | ICD-10-CM | POA: Insufficient documentation

## 2022-05-26 DIAGNOSIS — W06XXXA Fall from bed, initial encounter: Secondary | ICD-10-CM | POA: Insufficient documentation

## 2022-05-26 DIAGNOSIS — Y92019 Unspecified place in single-family (private) house as the place of occurrence of the external cause: Secondary | ICD-10-CM | POA: Insufficient documentation

## 2022-05-26 DIAGNOSIS — E119 Type 2 diabetes mellitus without complications: Secondary | ICD-10-CM | POA: Insufficient documentation

## 2022-05-26 DIAGNOSIS — S0181XA Laceration without foreign body of other part of head, initial encounter: Secondary | ICD-10-CM | POA: Insufficient documentation

## 2022-05-26 DIAGNOSIS — D649 Anemia, unspecified: Secondary | ICD-10-CM | POA: Diagnosis not present

## 2022-05-26 DIAGNOSIS — I6782 Cerebral ischemia: Secondary | ICD-10-CM | POA: Diagnosis not present

## 2022-05-26 DIAGNOSIS — R8290 Unspecified abnormal findings in urine: Secondary | ICD-10-CM | POA: Insufficient documentation

## 2022-05-26 DIAGNOSIS — E785 Hyperlipidemia, unspecified: Secondary | ICD-10-CM | POA: Insufficient documentation

## 2022-05-26 LAB — URINALYSIS, ROUTINE W REFLEX MICROSCOPIC
Bilirubin Urine: NEGATIVE
Glucose, UA: NEGATIVE mg/dL
Hgb urine dipstick: NEGATIVE
Ketones, ur: 5 mg/dL — AB
Nitrite: NEGATIVE
Protein, ur: NEGATIVE mg/dL
Specific Gravity, Urine: 1.019 (ref 1.005–1.030)
pH: 7 (ref 5.0–8.0)

## 2022-05-26 LAB — COMPREHENSIVE METABOLIC PANEL
ALT: 9 U/L (ref 0–44)
AST: 18 U/L (ref 15–41)
Albumin: 4.4 g/dL (ref 3.5–5.0)
Alkaline Phosphatase: 46 U/L (ref 38–126)
Anion gap: 10 (ref 5–15)
BUN: 22 mg/dL (ref 8–23)
CO2: 26 mmol/L (ref 22–32)
Calcium: 9.5 mg/dL (ref 8.9–10.3)
Chloride: 104 mmol/L (ref 98–111)
Creatinine, Ser: 0.66 mg/dL (ref 0.44–1.00)
GFR, Estimated: >60 mL/min (ref >60)
Glucose, Bld: 163 mg/dL — ABNORMAL HIGH (ref 70–99)
Potassium: 4.4 mmol/L (ref 3.5–5.1)
Sodium: 140 mmol/L (ref 135–145)
Total Bilirubin: 0.6 mg/dL (ref 0.3–1.2)
Total Protein: 8.4 g/dL — ABNORMAL HIGH (ref 6.5–8.1)

## 2022-05-26 LAB — CBC
HCT: 34.3 % — ABNORMAL LOW (ref 36.0–46.0)
Hemoglobin: 11.6 g/dL — ABNORMAL LOW (ref 12.0–15.0)
MCH: 30.3 pg (ref 26.0–34.0)
MCHC: 33.8 g/dL (ref 30.0–36.0)
MCV: 89.6 fL (ref 80.0–100.0)
Platelets: 275 10*3/uL (ref 150–400)
RBC: 3.83 MIL/uL — ABNORMAL LOW (ref 3.87–5.11)
RDW: 12.9 % (ref 11.5–15.5)
WBC: 8 10*3/uL (ref 4.0–10.5)
nRBC: 0 % (ref 0.0–0.2)

## 2022-05-26 LAB — TROPONIN I (HIGH SENSITIVITY): Troponin I (High Sensitivity): 10 ng/L (ref <18)

## 2022-05-26 LAB — LIPASE, BLOOD: Lipase: 22 U/L (ref 11–51)

## 2022-05-26 MED ORDER — ACETAMINOPHEN 160 MG/5ML PO SOLN
325.0000 mg | Freq: Once | ORAL | Status: DC
  Filled 2022-05-26: qty 20.3

## 2022-05-26 NOTE — ED Triage Notes (Signed)
Pt ems from [facility] for fall. Per ems, pt had n/v this am and staff turned her on her side. Staff turned around and pt fell out of bed. Pt has lac on chin from fall, bleeding controlled. Per ems, pt is nonverbal and bed bound.

## 2022-05-26 NOTE — Discharge Instructions (Signed)
Your mother has been seen in the Emergency Department (ED) today for a fall.  Work up does not show any concerning injuries.    Please follow up with your doctor regarding today's Emergency Department (ED) visit and your recent fall.    Return to the ED if you have any headache, confusion, slurred speech, fever, vomiting, abdominal pain, weakness/numbness of any arm or leg, or any increased pain.

## 2022-05-26 NOTE — ED Notes (Signed)
Daughter at bedside.

## 2022-05-26 NOTE — ED Provider Notes (Signed)
[facility] Provider Note    Event Date/Time   First MD Initiated Contact with Patient 05/26/22 [phone]     (approximate)   History   Fall  HPI  Anna Rubio is a 82 y.o. female    EMS and also the patient's daughter who is Petrone the phone, give [place] accounts of the patient was being rolled to her side by staff when at some point she suffered a fall.  She also vomited, believe the vomiting occurred before the fall and that may have been the reason she was being rolled onto her side  She suffered a laceration to her chin.  No reported loss consciousness   Not much for old charts to review, but patient does have a documented history of dementia.  Was able to find primary care note in care everywhere, patient is not listed as taking any anticoagulant according to note from March 26.  Primary care note reports history to allergy to aspirin and penicillin  Past medical history of diabetes hyperlipidemia and hypertension  Physical Exam   Triage Vital Signs: ED Triage Vitals [05/26/22 0704]  Enc Vitals Group     BP (!) 136/58     Pulse Rate 64     Resp 18     Temp 97.7 F (36.5 C)     Temp Source Oral     SpO2      Weight      Height      Head Circumference      Peak Flow      Pain Score      Pain Loc      Pain Edu?      Excl. in GC?     Most recent vital signs: Vitals:   05/26/22 0704  BP: (!) 136/58  Pulse: 64  Resp: 18  Temp: 97.7 F (36.5 C)     General: Awake, no distress.  Patient able to say "good morning" but otherwise does not hold meaningful conversation.  Her daughter is at the bedside reports this is her normal mentation.  She is bedbound.  She is primarily nonverbal due to severe dementia.  Normocephalic atraumatic with exception to a small approximately 1/2 cm linear laceration on the mentum of the chin CV:  Good peripheral perfusion.  Normal sounds and rate Resp:  Normal effort.  Clear bilaterally Abd:  No distention.   Soft nontender nondistended Other:  Able to range extremities to range of motion, patient does not particularly participate in following commands though.  No obvious pain discomfort deformity shortening or otherwise noted   ED Results / Procedures / Treatments   Labs (all labs ordered are listed, but only abnormal results are displayed) Labs Reviewed  CBC - Abnormal; Notable for the following components:      Result Value   RBC 3.83 (*)    Hemoglobin 11.6 (*)    HCT 34.3 (*)    All other components within normal limits  COMPREHENSIVE METABOLIC PANEL - Abnormal; Notable for the following components:   Glucose, Bld 163 (*)    Total Protein 8.4 (*)    All other components within normal limits  URINALYSIS, ROUTINE W REFLEX MICROSCOPIC - Abnormal; Notable for the following components:   Color, Urine YELLOW (*)    APPearance CLOUDY (*)    Ketones, ur 5 (*)    Leukocytes,Ua TRACE (*)    Bacteria, UA MANY (*)    All other components within normal limits  URINE  CULTURE  LIPASE, BLOOD  CBG MONITORING, ED  TROPONIN I (HIGH SENSITIVITY)     EKG  His interpreted by me at 7:10 AM heart rate 60 QRS 90 QTc 440 Normal sinus rhythm.  Mild ST segment abnormality noted in inferolateral distribution.  No STEMI (no old for comparison)   RADIOLOGY   CT Head Wo Contrast  Result Date: 05/26/2022 CLINICAL DATA:  Patient fell out of bed this morning. Patient is nonverbal. EXAM: CT HEAD WITHOUT CONTRAST CT MAXILLOFACIAL WITHOUT CONTRAST CT CERVICAL SPINE WITHOUT CONTRAST TECHNIQUE: Multidetector CT imaging of the head, cervical spine, and maxillofacial structures were performed using the standard protocol without intravenous contrast. Multiplanar CT image reconstructions of the cervical spine and maxillofacial structures were also generated. RADIATION DOSE REDUCTION: This exam was performed according to the departmental dose-optimization program which includes automated exposure control, adjustment  of the mA and/or kV according to patient size and/or use of iterative reconstruction technique. COMPARISON:  Head CT 11/02/2021.  Cervical spine CT 08/09/2021. FINDINGS: CT HEAD FINDINGS Brain: There is no evidence for acute hemorrhage, hydrocephalus, mass lesion, or abnormal extra-axial fluid collection. No definite CT evidence for acute infarction. Diffuse loss of parenchymal volume is consistent with atrophy. Patchy low attenuation in the deep hemispheric and periventricular white matter is nonspecific, but likely reflects chronic microvascular ischemic demyelination. Vascular: No hyperdense vessel or unexpected calcification. Skull: No evidence for fracture. No worrisome lytic or sclerotic lesion. Other: None. CT MAXILLOFACIAL FINDINGS Osseous: No fracture or mandibular dislocation. No destructive process. Images of the mandible arm dramatically motion degraded. Orbits: Motion artifact noted in the orbital regions. Within this limitation there is no evidence for orbital fracture. No zygomatic arch fracture. Sinuses: Clear. Soft tissues: Unremarkable. CT CERVICAL SPINE FINDINGS Alignment: Straightening of normal cervical lordosis. Stable trace retrolisthesis of C4 on 5 compatible with the marked loss of intervertebral disc height at the same level. Skull base and vertebrae: No acute fracture. No primary bone lesion or focal pathologic process. Soft tissues and spinal canal: No prevertebral fluid or swelling. No visible canal hematoma. Disc levels: Loss of disc height noted C3-4, C4-5, and C5-6, similar to prior. Facets are well aligned bilaterally. Upper chest: Unremarkable. Other: None. IMPRESSION: 1. No acute intracranial abnormality. 2. Atrophy with chronic small vessel ischemic disease. 3. No evidence for facial bone fracture. 4. No evidence for cervical spine fracture or traumatic subluxation. 5. Degenerative changes in the cervical spine as above. Electronically Signed   By: Kennith Center M.D.   On:  05/26/2022 08:07   CT Maxillofacial Wo Contrast  Result Date: 05/26/2022 CLINICAL DATA:  Patient fell out of bed this morning. Patient is nonverbal. EXAM: CT HEAD WITHOUT CONTRAST CT MAXILLOFACIAL WITHOUT CONTRAST CT CERVICAL SPINE WITHOUT CONTRAST TECHNIQUE: Multidetector CT imaging of the head, cervical spine, and maxillofacial structures were performed using the standard protocol without intravenous contrast. Multiplanar CT image reconstructions of the cervical spine and maxillofacial structures were also generated. RADIATION DOSE REDUCTION: This exam was performed according to the departmental dose-optimization program which includes automated exposure control, adjustment of the mA and/or kV according to patient size and/or use of iterative reconstruction technique. COMPARISON:  Head CT 11/02/2021.  Cervical spine CT 08/09/2021. FINDINGS: CT HEAD FINDINGS Brain: There is no evidence for acute hemorrhage, hydrocephalus, mass lesion, or abnormal extra-axial fluid collection. No definite CT evidence for acute infarction. Diffuse loss of parenchymal volume is consistent with atrophy. Patchy low attenuation in the deep hemispheric and periventricular white matter is nonspecific, but likely  reflects chronic microvascular ischemic demyelination. Vascular: No hyperdense vessel or unexpected calcification. Skull: No evidence for fracture. No worrisome lytic or sclerotic lesion. Other: None. CT MAXILLOFACIAL FINDINGS Osseous: No fracture or mandibular dislocation. No destructive process. Images of the mandible arm dramatically motion degraded. Orbits: Motion artifact noted in the orbital regions. Within this limitation there is no evidence for orbital fracture. No zygomatic arch fracture. Sinuses: Clear. Soft tissues: Unremarkable. CT CERVICAL SPINE FINDINGS Alignment: Straightening of normal cervical lordosis. Stable trace retrolisthesis of C4 on 5 compatible with the marked loss of intervertebral disc height at the  same level. Skull base and vertebrae: No acute fracture. No primary bone lesion or focal pathologic process. Soft tissues and spinal canal: No prevertebral fluid or swelling. No visible canal hematoma. Disc levels: Loss of disc height noted C3-4, C4-5, and C5-6, similar to prior. Facets are well aligned bilaterally. Upper chest: Unremarkable. Other: None. IMPRESSION: 1. No acute intracranial abnormality. 2. Atrophy with chronic small vessel ischemic disease. 3. No evidence for facial bone fracture. 4. No evidence for cervical spine fracture or traumatic subluxation. 5. Degenerative changes in the cervical spine as above. Electronically Signed   By: Kennith Center M.D.   On: 05/26/2022 08:07   CT Cervical Spine Wo Contrast  Result Date: 05/26/2022 CLINICAL DATA:  Patient fell out of bed this morning. Patient is nonverbal. EXAM: CT HEAD WITHOUT CONTRAST CT MAXILLOFACIAL WITHOUT CONTRAST CT CERVICAL SPINE WITHOUT CONTRAST TECHNIQUE: Multidetector CT imaging of the head, cervical spine, and maxillofacial structures were performed using the standard protocol without intravenous contrast. Multiplanar CT image reconstructions of the cervical spine and maxillofacial structures were also generated. RADIATION DOSE REDUCTION: This exam was performed according to the departmental dose-optimization program which includes automated exposure control, adjustment of the mA and/or kV according to patient size and/or use of iterative reconstruction technique. COMPARISON:  Head CT 11/02/2021.  Cervical spine CT 08/09/2021. FINDINGS: CT HEAD FINDINGS Brain: There is no evidence for acute hemorrhage, hydrocephalus, mass lesion, or abnormal extra-axial fluid collection. No definite CT evidence for acute infarction. Diffuse loss of parenchymal volume is consistent with atrophy. Patchy low attenuation in the deep hemispheric and periventricular white matter is nonspecific, but likely reflects chronic microvascular ischemic demyelination.  Vascular: No hyperdense vessel or unexpected calcification. Skull: No evidence for fracture. No worrisome lytic or sclerotic lesion. Other: None. CT MAXILLOFACIAL FINDINGS Osseous: No fracture or mandibular dislocation. No destructive process. Images of the mandible arm dramatically motion degraded. Orbits: Motion artifact noted in the orbital regions. Within this limitation there is no evidence for orbital fracture. No zygomatic arch fracture. Sinuses: Clear. Soft tissues: Unremarkable. CT CERVICAL SPINE FINDINGS Alignment: Straightening of normal cervical lordosis. Stable trace retrolisthesis of C4 on 5 compatible with the marked loss of intervertebral disc height at the same level. Skull base and vertebrae: No acute fracture. No primary bone lesion or focal pathologic process. Soft tissues and spinal canal: No prevertebral fluid or swelling. No visible canal hematoma. Disc levels: Loss of disc height noted C3-4, C4-5, and C5-6, similar to prior. Facets are well aligned bilaterally. Upper chest: Unremarkable. Other: None. IMPRESSION: 1. No acute intracranial abnormality. 2. Atrophy with chronic small vessel ischemic disease. 3. No evidence for facial bone fracture. 4. No evidence for cervical spine fracture or traumatic subluxation. 5. Degenerative changes in the cervical spine as above. Electronically Signed   By: Kennith Center M.D.   On: 05/26/2022 08:07      PROCEDURES:  Critical Care performed: No  ..  Laceration Repair  Date/Time: 05/26/2022 7:47 AM  Performed by: Sharyn Creamer, MD Authorized by: Sharyn Creamer, MD   Consent:    Consent obtained:  Verbal   Consent given by:  Guardian (daughter)   Risks discussed:  Infection Universal protocol:    Imaging studies available: yes     Patient identity confirmed:  Arm band Anesthesia:    Anesthesia method:  None Laceration details:    Length (cm):  1.5   Depth (mm):  4 Exploration:    Imaging outcome: foreign body not noted     Wound  exploration: wound explored through full range of motion and entire depth of wound visualized     Wound extent: areolar tissue violated     Wound extent: fascia not violated, no foreign body, no signs of injury, no nerve damage, no tendon damage, no underlying fracture and no vascular damage     Contaminated: no   Treatment:    Area cleansed with:  Povidone-iodine   Irrigation solution:  Sterile saline   Irrigation volume:  100 ml   Irrigation method:  Syringe Skin repair:    Repair method:  Tissue adhesive Approximation:    Approximation:  Close Repair type:    Repair type:  Simple Comments:     Dermabond. Repaired with good effect. No bleeding.    MEDICATIONS ORDERED IN ED: Medications  acetaminophen (TYLENOL) 160 MG/5ML solution 325 mg (has no administration in time range)     IMPRESSION / MDM / ASSESSMENT AND PLAN / ED COURSE  I reviewed the triage vital signs and the nursing notes.                              Differential diagnosis includes, but is not limited to, followed bed, injuries suffered from fall, possible though the history is not clear preceding episode of vomiting, at the present time abdomen soft nontender nondistended, afebrile vital signs normal.  Daughter is at bedside reports patient appears to be in her normal mental status does not appear to be in any distress Discussed goals of care with daughter, we will proceed with imaging of the head neck as well as obtaining labs and In-N-Out catheterization to evaluate for possible urinary tract infection as well as metabolic panel to evaluate for possible other causes that might of led to vomiting.  At the present time she has no evidence of vomiting.  Again the abdomen is soft nontender nondistended through all quadrants.  Doubt acute intra-abdominal causation at this point.  No distention no evidence of abuse to suggest an obstructive picture.  Patient's presentation is most consistent with acute complicated  illness / injury requiring diagnostic workup.   The patient is on the cardiac monitor to evaluate for evidence of arrhythmia and/or significant heart rate changes.    Clinical Course as of 05/26/22 1610  Thu May 26, 2022  0807 CT Head Wo Contrast CT head interpreted by me for acute gross intracranial pathology.  No intracranial hemorrhage noted [MQ]    Clinical Course User Index [MQ] Sharyn Creamer, MD   ----------------------------------------- 9:36 AM on 05/26/2022 ----------------------------------------- Labs demonstrate very mild anemia.  Comprehensive metabolic panel normal.  Urinalysis is somewhat cloudy with bacteria noted and trace leukocytes.  Patient is afebrile, no elevated white count, and has no specific concerns around urinary symptoms.  Discussed with the patient's daughter, and will send urine for culture.  If urine culture reveals pathogen in June  then I would recommend treatment, at this time unclear if the patient has an active UTI though may be colonized  Patient's daughter comfortable taking her back to her care facility.  She advises that she typically picks her up uses a wheelchair, and takes her to her doctors appointments with Dr. Letitia Libra.  Patient being discharged to the care of the daughter, staff assisting patient out to daughter vehicle via wheelchair  FINAL CLINICAL IMPRESSION(S) / ED DIAGNOSES   Final diagnoses:  Fall, initial encounter  Chin laceration, initial encounter     Rx / DC Orders   ED Discharge Orders     None        Note:  This document was prepared using Dragon voice recognition software and may include unintentional dictation errors.   Sharyn Creamer, MD 05/26/22 [phone]

## 2022-05-27 LAB — URINE CULTURE

## 2022-05-28 LAB — URINE CULTURE
Culture: >=100000 — AB
Special Requests: NORMAL

## 2022-07-17 ENCOUNTER — Other Ambulatory Visit: Payer: Self-pay

## 2022-07-17 ENCOUNTER — Inpatient Hospital Stay
Admission: EM | Admit: 2022-07-17 | Discharge: 2022-07-22 | DRG: 388 | Disposition: A | Discharge status: Skilled Nursing Facility | Payer: Medicare HMO | Source: Skilled Nursing Facility | Attending: Internal Medicine | Admitting: Internal Medicine

## 2022-07-17 ENCOUNTER — Emergency Department: Payer: Medicare HMO

## 2022-07-17 DIAGNOSIS — Z90710 Acquired absence of both cervix and uterus: Secondary | ICD-10-CM

## 2022-07-17 DIAGNOSIS — R109 Unspecified abdominal pain: Secondary | ICD-10-CM | POA: Diagnosis not present

## 2022-07-17 DIAGNOSIS — K219 Gastro-esophageal reflux disease without esophagitis: Secondary | ICD-10-CM | POA: Diagnosis not present

## 2022-07-17 DIAGNOSIS — Z880 Allergy status to penicillin: Secondary | ICD-10-CM

## 2022-07-17 DIAGNOSIS — Z886 Allergy status to analgesic agent status: Secondary | ICD-10-CM

## 2022-07-17 DIAGNOSIS — D649 Anemia, unspecified: Secondary | ICD-10-CM | POA: Diagnosis present

## 2022-07-17 DIAGNOSIS — I10 Essential (primary) hypertension: Secondary | ICD-10-CM | POA: Diagnosis present

## 2022-07-17 DIAGNOSIS — N3000 Acute cystitis without hematuria: Secondary | ICD-10-CM | POA: Diagnosis not present

## 2022-07-17 DIAGNOSIS — F0390 Unspecified dementia without behavioral disturbance: Secondary | ICD-10-CM | POA: Diagnosis present

## 2022-07-17 DIAGNOSIS — Z79899 Other long term (current) drug therapy: Secondary | ICD-10-CM | POA: Diagnosis not present

## 2022-07-17 DIAGNOSIS — K920 Hematemesis: Secondary | ICD-10-CM | POA: Diagnosis present

## 2022-07-17 DIAGNOSIS — E43 Unspecified severe protein-calorie malnutrition: Secondary | ICD-10-CM | POA: Diagnosis present

## 2022-07-17 DIAGNOSIS — K5641 Fecal impaction: Principal | ICD-10-CM | POA: Diagnosis present

## 2022-07-17 DIAGNOSIS — Z681 Body mass index (BMI) 19 or less, adult: Secondary | ICD-10-CM

## 2022-07-17 DIAGNOSIS — Z7984 Long term (current) use of oral hypoglycemic drugs: Secondary | ICD-10-CM | POA: Diagnosis not present

## 2022-07-17 DIAGNOSIS — K449 Diaphragmatic hernia without obstruction or gangrene: Secondary | ICD-10-CM | POA: Diagnosis not present

## 2022-07-17 DIAGNOSIS — R531 Weakness: Secondary | ICD-10-CM | POA: Diagnosis not present

## 2022-07-17 DIAGNOSIS — R112 Nausea with vomiting, unspecified: Secondary | ICD-10-CM | POA: Diagnosis not present

## 2022-07-17 DIAGNOSIS — N390 Urinary tract infection, site not specified: Secondary | ICD-10-CM | POA: Diagnosis present

## 2022-07-17 DIAGNOSIS — F32A Depression, unspecified: Secondary | ICD-10-CM | POA: Diagnosis not present

## 2022-07-17 DIAGNOSIS — K5730 Diverticulosis of large intestine without perforation or abscess without bleeding: Secondary | ICD-10-CM | POA: Diagnosis not present

## 2022-07-17 DIAGNOSIS — G301 Alzheimer's disease with late onset: Secondary | ICD-10-CM | POA: Diagnosis not present

## 2022-07-17 DIAGNOSIS — K922 Gastrointestinal hemorrhage, unspecified: Secondary | ICD-10-CM | POA: Diagnosis not present

## 2022-07-17 DIAGNOSIS — F0393 Unspecified dementia, unspecified severity, with mood disturbance: Secondary | ICD-10-CM | POA: Diagnosis not present

## 2022-07-17 DIAGNOSIS — E119 Type 2 diabetes mellitus without complications: Secondary | ICD-10-CM

## 2022-07-17 DIAGNOSIS — F0153 Vascular dementia, unspecified severity, with mood disturbance: Secondary | ICD-10-CM | POA: Diagnosis not present

## 2022-07-17 DIAGNOSIS — Z7401 Bed confinement status: Secondary | ICD-10-CM | POA: Diagnosis not present

## 2022-07-17 DIAGNOSIS — I700 Atherosclerosis of aorta: Secondary | ICD-10-CM | POA: Diagnosis not present

## 2022-07-17 DIAGNOSIS — R4182 Altered mental status, unspecified: Secondary | ICD-10-CM | POA: Diagnosis not present

## 2022-07-17 DIAGNOSIS — K5900 Constipation, unspecified: Secondary | ICD-10-CM | POA: Diagnosis present

## 2022-07-17 LAB — URINALYSIS, ROUTINE W REFLEX MICROSCOPIC
Bilirubin Urine: NEGATIVE
Glucose, UA: 50 mg/dL — AB
Ketones, ur: NEGATIVE mg/dL
Nitrite: NEGATIVE
Protein, ur: NEGATIVE mg/dL
Specific Gravity, Urine: 1.03 (ref 1.005–1.030)
WBC, UA: >50 WBC/hpf (ref 0–5)
pH: 5 (ref 5.0–8.0)

## 2022-07-17 LAB — CBC WITH DIFFERENTIAL/PLATELET
Abs Immature Granulocytes: 0.03 10*3/uL (ref 0.00–0.07)
Basophils Absolute: 0 10*3/uL (ref 0.0–0.1)
Basophils Relative: 0 %
Eosinophils Absolute: 0 10*3/uL (ref 0.0–0.5)
Eosinophils Relative: 0 %
HCT: 35.5 % — ABNORMAL LOW (ref 36.0–46.0)
Hemoglobin: 11.1 g/dL — ABNORMAL LOW (ref 12.0–15.0)
Immature Granulocytes: 0 %
Lymphocytes Relative: 11 %
Lymphs Abs: 1 10*3/uL (ref 0.7–4.0)
MCH: 29.3 pg (ref 26.0–34.0)
MCHC: 31.3 g/dL (ref 30.0–36.0)
MCV: 93.7 fL (ref 80.0–100.0)
Monocytes Absolute: 0.4 10*3/uL (ref 0.1–1.0)
Monocytes Relative: 5 %
Neutro Abs: 7.2 10*3/uL (ref 1.7–7.7)
Neutrophils Relative %: 84 %
Platelets: 269 10*3/uL (ref 150–400)
RBC: 3.79 MIL/uL — ABNORMAL LOW (ref 3.87–5.11)
RDW: 12.8 % (ref 11.5–15.5)
WBC: 8.7 10*3/uL (ref 4.0–10.5)
nRBC: 0 % (ref 0.0–0.2)

## 2022-07-17 LAB — LACTIC ACID, PLASMA
Lactic Acid, Venous: 2.2 mmol/L (ref 0.5–1.9)
Lactic Acid, Venous: 1.6 mmol/L (ref 0.5–1.9)

## 2022-07-17 LAB — COMPREHENSIVE METABOLIC PANEL
ALT: 11 U/L (ref 0–44)
AST: 24 U/L (ref 15–41)
Albumin: 4.5 g/dL (ref 3.5–5.0)
Alkaline Phosphatase: 51 U/L (ref 38–126)
Anion gap: 12 (ref 5–15)
BUN: 27 mg/dL — ABNORMAL HIGH (ref 8–23)
CO2: 25 mmol/L (ref 22–32)
Calcium: 9.4 mg/dL (ref 8.9–10.3)
Chloride: 106 mmol/L (ref 98–111)
Creatinine, Ser: 0.74 mg/dL (ref 0.44–1.00)
GFR, Estimated: >60 mL/min (ref >60)
Glucose, Bld: 242 mg/dL — ABNORMAL HIGH (ref 70–99)
Potassium: 4 mmol/L (ref 3.5–5.1)
Sodium: 143 mmol/L (ref 135–145)
Total Bilirubin: 0.6 mg/dL (ref 0.3–1.2)
Total Protein: 8.5 g/dL — ABNORMAL HIGH (ref 6.5–8.1)

## 2022-07-17 LAB — CBG MONITORING, ED: Glucose-Capillary: 123 mg/dL — ABNORMAL HIGH (ref 70–99)

## 2022-07-17 LAB — GLUCOSE, CAPILLARY: Glucose-Capillary: 103 mg/dL — ABNORMAL HIGH (ref 70–99)

## 2022-07-17 LAB — LIPASE, BLOOD: Lipase: 22 U/L (ref 11–51)

## 2022-07-17 MED ORDER — ONDANSETRON HCL 4 MG/2ML IJ SOLN
4.0000 mg | Freq: Once | INTRAMUSCULAR | Status: AC
  Administered 2022-07-17: 4 mg via INTRAVENOUS
  Filled 2022-07-17: qty 2

## 2022-07-17 MED ORDER — SODIUM CHLORIDE 0.9 % IV SOLN: INTRAVENOUS | Status: AC

## 2022-07-17 MED ORDER — ATENOLOL 25 MG PO TABS
25.0000 mg | ORAL_TABLET | Freq: Every day | ORAL | Status: DC
  Administered 2022-07-17 – 2022-07-21 (×3): 25 mg via ORAL
  Filled 2022-07-17 (×5): qty 1

## 2022-07-17 MED ORDER — HEPARIN SODIUM (PORCINE) 5000 UNIT/ML IJ SOLN: 5000.0000 [IU] | Freq: Three times a day (TID) | INTRAMUSCULAR | Status: DC

## 2022-07-17 MED ORDER — DIVALPROEX SODIUM 125 MG PO CSDR
125.0000 mg | DELAYED_RELEASE_CAPSULE | Freq: Two times a day (BID) | ORAL | Status: DC
  Administered 2022-07-17 – 2022-07-22 (×10): 125 mg via ORAL
  Filled 2022-07-17 (×13): qty 1

## 2022-07-17 MED ORDER — HYDRALAZINE HCL 20 MG/ML IJ SOLN
5.0000 mg | Freq: Three times a day (TID) | INTRAMUSCULAR | Status: AC | PRN
  Administered 2022-07-19 – 2022-07-20 (×3): 5 mg via INTRAVENOUS
  Filled 2022-07-17 (×3): qty 1

## 2022-07-17 MED ORDER — LACTATED RINGERS IV BOLUS
1000.0000 mL | Freq: Once | INTRAVENOUS | Status: AC
  Administered 2022-07-17: 1000 mL via INTRAVENOUS

## 2022-07-17 MED ORDER — ACETAMINOPHEN 325 MG PO TABS
650.0000 mg | ORAL_TABLET | Freq: Four times a day (QID) | ORAL | Status: AC | PRN
  Administered 2022-07-19: 650 mg via ORAL
  Filled 2022-07-17: qty 2

## 2022-07-17 MED ORDER — PANTOPRAZOLE SODIUM 40 MG IV SOLR: 40.0000 mg | Freq: Two times a day (BID) | INTRAVENOUS | Status: DC

## 2022-07-17 MED ORDER — SODIUM CHLORIDE 0.9 % IV SOLN
1.0000 g | INTRAVENOUS | Status: DC
  Administered 2022-07-18: 1 g via INTRAVENOUS
  Filled 2022-07-17: qty 10

## 2022-07-17 MED ORDER — POLYETHYLENE GLYCOL 3350 17 G PO PACK
17.0000 g | PACK | Freq: Every day | ORAL | Status: DC
  Administered 2022-07-17: 17 g via ORAL
  Filled 2022-07-17: qty 1

## 2022-07-17 MED ORDER — SERTRALINE HCL 50 MG PO TABS
50.0000 mg | ORAL_TABLET | Freq: Every day | ORAL | Status: DC
  Administered 2022-07-17 – 2022-07-22 (×6): 50 mg via ORAL
  Filled 2022-07-17 (×6): qty 1

## 2022-07-17 MED ORDER — ONDANSETRON HCL 4 MG/2ML IJ SOLN: 4.0000 mg | Freq: Four times a day (QID) | INTRAMUSCULAR | Status: AC | PRN

## 2022-07-17 MED ORDER — SODIUM CHLORIDE 0.9 % IV SOLN: 12.5000 mg | Freq: Three times a day (TID) | INTRAVENOUS | Status: AC | PRN

## 2022-07-17 MED ORDER — LISINOPRIL 10 MG PO TABS
10.0000 mg | ORAL_TABLET | Freq: Every day | ORAL | Status: DC
  Administered 2022-07-17 – 2022-07-22 (×6): 10 mg via ORAL
  Filled 2022-07-17 (×6): qty 1

## 2022-07-17 MED ORDER — INSULIN ASPART 100 UNIT/ML IJ SOLN
0.0000 [IU] | Freq: Three times a day (TID) | INTRAMUSCULAR | Status: DC
  Administered 2022-07-17: 1 [IU] via SUBCUTANEOUS
  Administered 2022-07-18: 0 [IU] via SUBCUTANEOUS
  Administered 2022-07-18: 2 [IU] via SUBCUTANEOUS
  Administered 2022-07-21 (×2): 1 [IU] via SUBCUTANEOUS
  Administered 2022-07-22: 2 [IU] via SUBCUTANEOUS
  Filled 2022-07-17 (×5): qty 1

## 2022-07-17 MED ORDER — IOHEXOL 300 MG/ML  SOLN
100.0000 mL | Freq: Once | INTRAMUSCULAR | Status: AC | PRN
  Administered 2022-07-17: 100 mL via INTRAVENOUS

## 2022-07-17 MED ORDER — PANTOPRAZOLE SODIUM 40 MG PO TBEC
40.0000 mg | DELAYED_RELEASE_TABLET | Freq: Every day | ORAL | Status: DC
  Filled 2022-07-17: qty 1

## 2022-07-17 MED ORDER — SENNOSIDES-DOCUSATE SODIUM 8.6-50 MG PO TABS: 1.0000 | ORAL_TABLET | Freq: Every evening | ORAL | Status: DC | PRN

## 2022-07-17 MED ORDER — MELATONIN 5 MG PO TABS
5.0000 mg | ORAL_TABLET | Freq: Every evening | ORAL | Status: DC | PRN
  Administered 2022-07-19 – 2022-07-20 (×3): 5 mg via ORAL
  Filled 2022-07-17 (×3): qty 1

## 2022-07-17 MED ORDER — PANTOPRAZOLE 80MG IVPB - SIMPLE MED
80.0000 mg | Freq: Once | INTRAVENOUS | Status: AC
  Administered 2022-07-17: 80 mg via INTRAVENOUS
  Filled 2022-07-17: qty 100

## 2022-07-17 MED ORDER — SENNOSIDES-DOCUSATE SODIUM 8.6-50 MG PO TABS
1.0000 | ORAL_TABLET | Freq: Two times a day (BID) | ORAL | Status: DC
  Administered 2022-07-17 (×2): 1 via ORAL
  Filled 2022-07-17 (×2): qty 1

## 2022-07-17 MED ORDER — PANTOPRAZOLE INFUSION (NEW) - SIMPLE MED
8.0000 mg/h | INTRAVENOUS | Status: DC
  Administered 2022-07-17 – 2022-07-19 (×5): 8 mg/h via INTRAVENOUS
  Filled 2022-07-17 (×5): qty 100

## 2022-07-17 MED ORDER — LACTATED RINGERS IV BOLUS
500.0000 mL | Freq: Once | INTRAVENOUS | Status: AC
  Administered 2022-07-17: 500 mL via INTRAVENOUS

## 2022-07-17 MED ORDER — VITAMIN D 25 MCG (1000 UNIT) PO TABS
1000.0000 [IU] | ORAL_TABLET | Freq: Every day | ORAL | Status: DC
  Administered 2022-07-17 – 2022-07-22 (×6): 1000 [IU] via ORAL
  Filled 2022-07-17 (×6): qty 1

## 2022-07-17 MED ORDER — INSULIN ASPART 100 UNIT/ML IJ SOLN: 0.0000 [IU] | Freq: Every day | INTRAMUSCULAR | Status: DC

## 2022-07-17 MED ORDER — ACETAMINOPHEN 650 MG RE SUPP: 650.0000 mg | Freq: Four times a day (QID) | RECTAL | Status: AC | PRN

## 2022-07-17 MED ORDER — FERROUS SULFATE 325 (65 FE) MG PO TABS
324.0000 mg | ORAL_TABLET | Freq: Every day | ORAL | Status: DC
  Administered 2022-07-20: 325 mg via ORAL
  Administered 2022-07-21 – 2022-07-22 (×2): 324 mg via ORAL
  Filled 2022-07-17 (×4): qty 1

## 2022-07-17 MED ORDER — ONDANSETRON HCL 4 MG PO TABS: 4.0000 mg | ORAL_TABLET | Freq: Four times a day (QID) | ORAL | Status: AC | PRN

## 2022-07-17 MED ORDER — SODIUM CHLORIDE 0.9 % IV SOLN
1.0000 g | Freq: Once | INTRAVENOUS | Status: AC
  Administered 2022-07-17: 1 g via INTRAVENOUS
  Filled 2022-07-17: qty 10

## 2022-07-17 MED ORDER — MAGNESIUM OXIDE 400 MG PO TABS
400.0000 mg | ORAL_TABLET | Freq: Every day | ORAL | Status: DC
  Administered 2022-07-17 – 2022-07-22 (×6): 400 mg via ORAL
  Filled 2022-07-17 (×9): qty 1

## 2022-07-17 NOTE — ED Triage Notes (Addendum)
Pt to ED with daughter, pt arrives POV from [facility]. pt has hx dementia. Per daughter pt has had coffee ground emesis since yesterday. Pt is not on blood thinners. Per daughter pt seems more sleepy than normla

## 2022-07-17 NOTE — Assessment & Plan Note (Signed)
Home metformin not resumed on admission Insulin SSI with at bedtime coverage ordered Goal inpatient blood glucose level 140-180

## 2022-07-17 NOTE — Assessment & Plan Note (Signed)
With altered mental status, we will treat as UTI at this time Continue with ceftriaxone 2 g IV daily 

## 2022-07-17 NOTE — Assessment & Plan Note (Signed)
-   Resumed home sertraline 50 mg daily 

## 2022-07-17 NOTE — ED Provider Notes (Signed)
[facility] Provider Note    Event Date/Time   First MD Initiated Contact with Patient 07/17/22 1003     (approximate)   History   Chief Complaint Hematemesis   HPI  Anna Rubio is a 82 y.o. female with past medical history of hypertension, anemia, and dementia who presents to the ED complaining of nausea and vomiting.  Per daughter, patient has had multiple episodes of vomiting since yesterday.  Vomit was initially a yellow color, but daughter reports her episodes earlier today have been "darker and bilious."  Daughter states that patient has appeared weaker than usual, slower to respond than her typical.  She has not had any fevers, cough, difficulty breathing, or diarrhea.  Patient has not been able to communicate to her daughter whether she has been in pain.  Daughter is not aware of any sick contacts, patient currently resides at [facility].     Physical Exam   Triage Vital Signs: ED Triage Vitals [07/17/22 1007]  Enc Vitals Group     BP      Pulse      Resp      Temp      Temp src      SpO2      Weight 95 lb (43.1 kg)     Height 5\' 5"  (1.651 m)     Head Circumference      Peak Flow      Pain Score      Pain Loc      Pain Edu?      Excl. in GC?     Most recent vital signs: Vitals:   07/17/22 1100 07/17/22 1130  BP: (!) 150/64   Pulse: 60 73  Resp: 15 18  Temp:    SpO2: 100% 100%    Constitutional: Somnolent but arousable to voice. Eyes: Conjunctivae are normal. Head: Atraumatic. Nose: No congestion/rhinnorhea. Mouth/Throat: Mucous membranes are moist.  Cardiovascular: Normal rate, regular rhythm. Grossly normal heart sounds.  2+ radial pulses bilaterally. Respiratory: Normal respiratory effort.  No retractions. Lungs CTAB. Gastrointestinal: Soft and diffusely tender to palpation with no rebound or guarding. No distention. Musculoskeletal: No lower extremity tenderness nor edema.  Neurologic: Opens eyes  to voice, unable to answer questions or follow commands.  Spontaneous movements noted in all 4 extremities.    ED Results / Procedures / Treatments   Labs (all labs ordered are listed, but only abnormal results are displayed) Labs Reviewed  CBC WITH DIFFERENTIAL/PLATELET - Abnormal; Notable for the following components:      Result Value   RBC 3.79 (*)    Hemoglobin 11.1 (*)    HCT 35.5 (*)    All other components within normal limits  COMPREHENSIVE METABOLIC PANEL - Abnormal; Notable for the following components:   Glucose, Bld 242 (*)    BUN 27 (*)    Total Protein 8.5 (*)    All other components within normal limits  URINALYSIS, ROUTINE W REFLEX MICROSCOPIC - Abnormal; Notable for the following components:   Color, Urine YELLOW (*)    APPearance CLOUDY (*)    Glucose, UA 50 (*)    Hgb urine dipstick MODERATE (*)    Leukocytes,Ua LARGE (*)    Bacteria, UA RARE (*)    All other components within normal limits  URINE CULTURE  LIPASE, BLOOD  SAMPLE TO BLOOD BANK     EKG  ED ECG REPORT I, Chesley Noon, the attending physician, personally viewed and  interpreted this ECG.   Date: 07/17/2022  EKG Time: 10:30  Rate: 61  Rhythm: normal sinus rhythm  Axis: Normal  Intervals:none  ST&T Change: Nonspecific T wave abnormality  RADIOLOGY CT abdomen/pelvis reviewed and interpreted by me with significant constipation but no evidence of obstruction or inflammatory changes.  PROCEDURES:  Critical Care performed: No  Procedures  ------------------------------------------------------------------------------------------------------------------- Fecal Disimpaction Procedure Note:  Performed by me:  Patient placed in the lateral recumbent position with knees drawn towards chest. Nurse present for patient support. Large amount of hard brown stool removed. No complications during  procedure.   ------------------------------------------------------------------------------------------------------------------   MEDICATIONS ORDERED IN ED: Medications  cefTRIAXone (ROCEPHIN) 1 g in sodium chloride 0.9 % 100 mL IVPB (1 g Intravenous New Bag/Given 07/17/22 1300)  lactated ringers bolus 1,000 mL (1,000 mLs Intravenous New Bag/Given 07/17/22 1022)  ondansetron (ZOFRAN) injection 4 mg (4 mg Intravenous Given 07/17/22 1021)  iohexol (OMNIPAQUE) 300 MG/ML solution 100 mL (100 mLs Intravenous Contrast Given 07/17/22 1120)     IMPRESSION / MDM / ASSESSMENT AND PLAN / ED COURSE  I reviewed the triage vital signs and the nursing notes.                              82 y.o. female with past medical history of hypertension, anemia, and dementia who presents to the ED for nausea, vomiting, and increased weakness with confusion since yesterday.  Patient's presentation is most consistent with acute presentation with potential threat to life or bodily function.  Differential diagnosis includes, but is not limited to, intracranial process, sepsis, UTI, bowel obstruction, diverticulitis, cholecystitis, biliary colic, dehydration, lecture abnormality, AKI, GI bleed.  Patient chronically ill-appearing but in no acute distress, vital signs are unremarkable.  She appears somnolent and less interactive than her baseline per daughter, but does not appear to have any focal neurologic deficits.  Abdomen is soft but does have some diffuse tenderness, will further assess with CT imaging of her head and abdomen/pelvis.  Labs and urinalysis are pending at this time, will hydrate with fluids and treat with IV Zofran.  Labs are reassuring with hemoglobin stable compared to previous, no vomiting here in the ED and I doubt significant GI bleed.  No significant anemia, leukocytosis, lecture abnormality, or AKI noted.  LFTs and lipase are unremarkable.  Urinalysis does appear concerning for infection, which  could be the source of patient's nausea and vomiting.  CT shows significant constipation but is negative for other acute process.  Manual disimpaction was performed for patient's constipation and given vomiting, case discussed with hospitalist for admission for UTI treatment.      FINAL CLINICAL IMPRESSION(S) / ED DIAGNOSES   Final diagnoses:  Acute cystitis without hematuria  Generalized weakness  Nausea and vomiting, unspecified vomiting type  Fecal impaction (HCC)     Rx / DC Orders   ED Discharge Orders     None        Note:  This document was prepared using Dragon voice recognition software and may include unintentional dictation errors.   Chesley Noon, MD 07/17/22 [phone]

## 2022-07-17 NOTE — ED Notes (Signed)
Daughter with pt. Per daughter, pt has been having nausea and vomiting since yesterday, with nothing to eat or drink today. Pt will say no, but not answering questions. Minimal eye opening. Pt is a full assist. Pt on cardiac, bp and pulse ox monitor.

## 2022-07-17 NOTE — Assessment & Plan Note (Addendum)
With vascular dementia Resumed home Depakote 125 mg p.o. twice daily

## 2022-07-17 NOTE — Assessment & Plan Note (Addendum)
--  Continue home atenolol, lisinopril  -- IV hydralazine PRN

## 2022-07-17 NOTE — Assessment & Plan Note (Signed)
Upper GI bleeding NOT confirmed. Reports emesis appeared dark, concerning for bleed. --Gi consulted --EGD today was normal --Resume regular diet --Stop IV PPI --Resume PO PPI tomorrow

## 2022-07-17 NOTE — ED Notes (Signed)
Per daughter, pt is oriented to self only. Pt may or may not talk at baseline

## 2022-07-17 NOTE — Assessment & Plan Note (Addendum)
With possible developing obstipation Patient is status post manual disimpaction via EDP Senna docusate 1 tablet, twice daily 3 doses ordered and GlycoLax 17 g p.o. nightly, 2 days were ordered on admission Senna docusate 1 tablet nightly as needed for mild constipation starting on 07/20/2022 at 07:00hr Strict I's and O's

## 2022-07-17 NOTE — Assessment & Plan Note (Signed)
Resolved, none since admission Likely due to severe constipation / obstipation --Antiemetics PRN

## 2022-07-17 NOTE — Hospital Course (Signed)
Ms. Anna Rubio is a 82 year old female with history of dementia, hypertension, non-insulin-dependent diabetes mellitus, depression, GERD, who presents emergency department for chief concerns of dark bilious, green emesis.  Per family, facility reports that patient vomited approximately 3 times on day of ED presentation.  Vitals in the ED showed temperature of 97.6, respiration rate of 15, heart rate of 60, blood pressure 150/64, SpO2 100% on room air.  Serum sodium is 143, potassium 4.0, chloride 106, bicarb 25, BUN of 27, serum creatinine of 0.74, EGFR greater than 60, nonfasting blood glucose 242, WBC 8.7, hemoglobin 11.1, platelets of 269.  High sensitive troponin is 10.  UA was positive for large leukocytes.  ED treatment: Ondansetron 4 mg IV one-time dose, ceftriaxone 1 g IV one-time dose, LR 1 L bolus.

## 2022-07-17 NOTE — H&P (Addendum)
History and Physical   Loyd Lamotte ZOX:[id number] DOB: 22-Aug-1940 DOA: 07/17/2022  PCP: Gracelyn Nurse, MD  Patient coming from: [facility] via EMS  I have personally briefly reviewed patient's old medical records in [facility] EMR.  Chief Concern: Dark bilious nausea and vomiting  HPI: Ms. Anna Rubio is a 82 year old female with history of dementia, hypertension, non-insulin-dependent diabetes mellitus, depression, GERD, who presents emergency department for chief concerns of dark bilious, green emesis.  Per family, facility reports that patient vomited approximately 3 times on day of ED presentation.  Vitals in the ED showed temperature of 97.6, respiration rate of 15, heart rate of 60, blood pressure 150/64, SpO2 100% on room air.  Serum sodium is 143, potassium 4.0, chloride 106, bicarb 25, BUN of 27, serum creatinine of 0.74, EGFR greater than 60, nonfasting blood glucose 242, WBC 8.7, hemoglobin 11.1, platelets of 269.  High sensitive troponin is 10.  UA was positive for large leukocytes.  ED treatment: Ondansetron 4 mg IV one-time dose, ceftriaxone 1 g IV one-time dose, LR 1 L bolus. --------------------------- At bedside, patient opens her eyes spontaneously, mumbles incoherently when I ask patient her name, age. She is nonverbal for the H&P process.  She does not appear to be in acute distress.  Social history: Patient is from [place].  ROS: Unable to complete as patient is nonverbal  ED Course: Discussed with emergency medicine provider, patient requiring hospitalization for chief concerns of nausea and vomiting secondary to UTI.  Assessment/Plan  Principal Problem:   Intractable nausea and vomiting Active Problems:   Diabetes mellitus type 2, noninsulin dependent (HCC)   Depression   Essential hypertension   Dementia (HCC)   UTI (urinary tract infection)   Constipation   Upper GI bleeding   Assessment and Plan:  * Intractable nausea and  vomiting Presumed secondary to urinary tract infection Symptomatic support: Ondansetron 4 mg p.o./IV every 6 hours as needed for nausea and vomiting, 5 days of coverage ordered; Phenergan 12.5 mg IV every 8 hours as needed for refractory nausea and vomiting, 1 day ordered Patient is status post LR 1 L bolus per EDP I ordered additional LR 500 mL bolus on admission followed by sodium chloride infusion at 125 mL/h, 1 day ordered  Upper GI bleeding Protonix bolus and gtt started Clear liquids NPO after midnight GI has been consulted via secure chat and Epic order Inpatient medical, telemetry  Constipation With possible developing obstipation Patient is status post manual disimpaction via EDP Senna docusate 1 tablet, twice daily 3 doses ordered and GlycoLax 17 g p.o. nightly, 2 days were ordered on admission Senna docusate 1 tablet nightly as needed for mild constipation starting on 07/20/2022 at 07:00hr Strict I's and O's  UTI (urinary tract infection) Present on admission We will check lactic acid, low clinical suspicion for sepsis at this time given patient is afebrile, without leukocytosis, therefore blood cultures will not be drawn Continue with ceftriaxone 1 g IV daily to complete 5-day course  Dementia (HCC) With vascular dementia Resumed home Depakote 125 mg p.o. twice daily  Essential hypertension Resume home atenolol 25 mg daily, lisinopril 10 mg daily Hydralazine 5 mg IV every 8 hours as needed for SBP greater 175, 4 days ordered  Depression Resumed home sertraline 50 mg daily  Diabetes mellitus type 2, noninsulin dependent (HCC) Home metformin not resumed on admission Insulin SSI with at bedtime coverage ordered Goal inpatient blood glucose level 140-180  Chart reviewed.   DVT prophylaxis:  Heparin 5000 units subcutaneous every 8 hours Code Status: full code Diet: Clear liquids as tolerated; with advance diet as tolerated to full liquids Family Communication:  Attempted to call daughter Katrina Stack at [phone], no pick up and automatic message states a voicemail box has not been established. No voicemail was able to be communicated Disposition Plan: Pending clinical course Consults called: None at this time Admission status: Telemetry medical, inpatient  Past Medical History:  Diagnosis Date   Anemia    Hypertension    Past Surgical History:  Procedure Laterality Date   VAGINAL HYSTERECTOMY     Social History:  reports that she has never smoked. She has never used smokeless tobacco. She reports that she does not currently use alcohol. She reports that she does not use drugs.  Allergies  Allergen Reactions   Aspirin Hives   Penicillins Hives   Family History  Problem Relation Age of Onset   Cancer Mother    Family history: Family history reviewed and not pertinent.  Prior to Admission medications   Medication Sig Start Date End Date Taking? Authorizing Provider  atenolol (TENORMIN) 25 MG tablet Take 25 mg by mouth daily.    [provider]  Cholecalciferol (VITAMIN D-1000 MAX ST) 25 MCG (1000 UT) tablet Take 1,000 Units by mouth daily.    [provider]  divalproex (DEPAKOTE SPRINKLE) 125 MG capsule Take 125 mg by mouth 2 (two) times daily. 12/01/20   [provider]  ferrous sulfate 325 (65 FE) MG tablet Take 1 tablet by mouth daily with breakfast.    [provider]  lisinopril (ZESTRIL) 10 MG tablet Take 10 mg by mouth daily.    [provider]  Loratadine 10 MG CAPS Take 1 capsule by mouth daily.    [provider]  magnesium oxide (MAG-OX) 400 MG tablet Take 400 mg by mouth daily.    [provider]  Melatonin 10 MG TABS Take 1 tablet by mouth at bedtime as needed.    [provider]  meloxicam (MOBIC) 7.5 MG tablet Take 7.5 mg by mouth daily. 04/18/22   [provider]  metFORMIN (GLUCOPHAGE) 1000 MG tablet Take 1,000 mg by mouth daily.     [provider]  pantoprazole (PROTONIX) 40 MG tablet Take 40 mg by mouth daily. 09/15/20   [provider]  sertraline (ZOLOFT) 50 MG tablet Take 50 mg by mouth daily.    [provider]   Physical Exam: Vitals:   07/17/22 1330 07/17/22 1400 07/17/22 1430 07/17/22 1500  BP: 133/72 (!) 149/74 (!) 164/67 (!) 146/81  Pulse: 62 62 (!) 108 70  Resp: 17 15 17 19   Temp:      TempSrc:      SpO2: 99% 100% 100% 100%  Weight:      Height:       Constitutional: appears age-appropriate, frail, cachectic appearing Eyes: PERRL, lids and conjunctivae normal ENMT: Mucous membranes are dry.  Unable to assess posterior pharynx as patient did not open mouth at my request.  Unable to assess hearing Neck: normal, supple, no masses, no thyromegaly Respiratory: clear to auscultation bilaterally, no wheezing, no crackles. Normal respiratory effort. No accessory muscle use.  Cardiovascular: Regular rate and rhythm, no murmurs / rubs / gallops. No extremity edema. 2+ pedal pulses. No carotid bruits.  Abdomen: no tenderness, no masses palpated, no hepatosplenomegaly. Bowel sounds positive.  Musculoskeletal: no clubbing / cyanosis. No joint deformity upper and lower extremities. Good ROM, no  contractures, no atrophy. Normal muscle tone.  Skin: no rashes, lesions, ulcers. No induration Neurologic: Unable to assess sensation, strength as patient does not follow commands  Psychiatric: Unable to assess judgment, insight, alertness, orientation, mood as patient is nonverbal   EKG: independently reviewed, showing sinus rhythm with rate of 61, QTc 463  Chest x-ray on Admission: I personally reviewed and I agree with radiologist reading as below.  CT ABDOMEN PELVIS W CONTRAST  Result Date: 07/17/2022 CLINICAL DATA:  Abdominal pain, acute, nonlocalized EXAM: CT ABDOMEN AND PELVIS WITH CONTRAST TECHNIQUE: Multidetector CT imaging of the abdomen and pelvis was performed using the standard  protocol following bolus administration of intravenous contrast. RADIATION DOSE REDUCTION: This exam was performed according to the departmental dose-optimization program which includes automated exposure control, adjustment of the mA and/or kV according to patient size and/or use of iterative reconstruction technique. CONTRAST:  OMNIPAQUE IOHEXOL 300 MG/ML  SOLN COMPARISON:  None Available. FINDINGS: Lower chest: Mild hypoventilatory changes within the lung bases. No acute findings. Hepatobiliary: 1.0 cm hyperenhancing focus within the right hepatic lobe (series 2, image 10), likely a flash-filling hemangioma. No additional focal liver abnormality is seen. No gallstones, gallbladder wall thickening, or biliary dilatation. Pancreas: Atrophic.  No ductal dilatation or inflammatory changes. Spleen: Normal in size without focal abnormality. Adrenals/Urinary Tract: Unremarkable adrenal glands. Kidneys enhance symmetrically without solid lesion, stone, or hydronephrosis. Ureters are nondilated. Urinary bladder appears unremarkable for the degree of distention. Stomach/Bowel: Stomach is within normal limits. Appendix not definitively visualized. Left-sided colonic diverticulosis. No evidence of bowel wall thickening, distention, or inflammatory changes. Large volume of stool throughout the colon and rectum including a large rectal stool ball measuring 7.5 cm in diameter. Vascular/Lymphatic: Scattered aortoiliac atherosclerotic calcifications without aneurysm. No abdominopelvic lymphadenopathy. Reproductive: Status post hysterectomy. No adnexal masses. Other: No free fluid. No abdominopelvic fluid collection. No pneumoperitoneum. No abdominal wall hernia. Musculoskeletal: No acute or significant osseous findings. Degenerative lower lumbar facet arthropathy with grade 1 anterolisthesis of L4 on L5. IMPRESSION: 1. No acute abdominopelvic findings. 2. Large volume of stool throughout the colon and rectum including a  large rectal stool ball measuring 7.5 cm in diameter. 3. Left-sided colonic diverticulosis without evidence of acute diverticulitis. 4. Aortic atherosclerosis (ICD10-I70.0). Electronically Signed   By: Duanne Guess D.O.   On: 07/17/2022 11:57   CT Head Wo Contrast  Result Date: 07/17/2022 CLINICAL DATA:  82 year old female with coffee-ground emesis, altered mental status. EXAM: CT HEAD WITHOUT CONTRAST TECHNIQUE: Contiguous axial images were obtained from the base of the skull through the vertex without intravenous contrast. RADIATION DOSE REDUCTION: This exam was performed according to the departmental dose-optimization program which includes automated exposure control, adjustment of the mA and/or kV according to patient size and/or use of iterative reconstruction technique. COMPARISON:  Brain MRI 06/24/2016.  Head CT 05/26/2022. FINDINGS: Brain: Chronic cerebral volume loss with ex vacuo appearing ventricular enlargement, mild progression since 2018. Disproportionate mesial temporal lobe atrophy (series 4, image 31). No midline shift, mass effect, evidence of mass lesion, intracranial hemorrhage or evidence of cortically based acute infarction. Asymmetric heterogeneity in the left deep gray nuclei appears stable since April. Stable gray-white differentiation elsewhere. Vascular: Calcified atherosclerosis at the skull base. No suspicious intracranial vascular hyperdensity. Skull: No acute osseous abnormality identified. Congenital incomplete ossification of the posterior C1 ring, normal variant. Sinuses/Orbits: Visualized paranasal sinuses and mastoids are stable and well aerated. Other: Stable orbit and scalp soft tissues. IMPRESSION: 1. No acute intracranial abnormality. 2. 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 Electronically Signed   By: Odessa Fleming M.D.   On: 07/17/2022 11:54    Labs on Admission: I have personally reviewed following labs  CBC: Recent Labs  Lab  07/17/22 1018  WBC 8.7  NEUTROABS 7.2  HGB 11.1*  HCT 35.5*  MCV 93.7  PLT 269   Basic Metabolic Panel: Recent Labs  Lab 07/17/22 1018  NA 143  K 4.0  CL 106  CO2 25  GLUCOSE 242*  BUN 27*  CREATININE 0.74  CALCIUM 9.4   GFR: Estimated Creatinine Clearance: 37.5 mL/min (by C-G formula based on SCr of 0.74 mg/dL).  Liver Function Tests: Recent Labs  Lab 07/17/22 1018  AST 24  ALT 11  ALKPHOS 51  BILITOT 0.6  PROT 8.5*  ALBUMIN 4.5   Recent Labs  Lab 07/17/22 1018  LIPASE 22   Urine analysis:    Component Value Date/Time   COLORURINE YELLOW (A) 07/17/2022 1018   APPEARANCEUR CLOUDY (A) 07/17/2022 1018   LABSPEC 1.030 07/17/2022 1018   PHURINE 5.0 07/17/2022 1018   GLUCOSEU 50 (A) 07/17/2022 1018   HGBUR MODERATE (A) 07/17/2022 1018   BILIRUBINUR NEGATIVE 07/17/2022 1018   KETONESUR NEGATIVE 07/17/2022 1018   PROTEINUR NEGATIVE 07/17/2022 1018   NITRITE NEGATIVE 07/17/2022 1018   LEUKOCYTESUR LARGE (A) 07/17/2022 1018   This document was prepared using Dragon Voice Recognition software and may include unintentional dictation errors.  Dr. Sedalia Muta [facility]  If 7PM-7AM, please contact overnight-coverage provider If 7AM-7PM, please contact day attending provider [email or web address]  07/17/2022, 3:49 PM

## 2022-07-18 DIAGNOSIS — R112 Nausea with vomiting, unspecified: Secondary | ICD-10-CM

## 2022-07-18 DIAGNOSIS — E43 Unspecified severe protein-calorie malnutrition: Secondary | ICD-10-CM | POA: Diagnosis present

## 2022-07-18 LAB — URINE CULTURE: Culture: NO GROWTH

## 2022-07-18 LAB — CBC
HCT: 28.9 % — ABNORMAL LOW (ref 36.0–46.0)
Hemoglobin: 9.2 g/dL — ABNORMAL LOW (ref 12.0–15.0)
MCH: 29.6 pg (ref 26.0–34.0)
MCHC: 31.8 g/dL (ref 30.0–36.0)
MCV: 92.9 fL (ref 80.0–100.0)
Platelets: 207 10*3/uL (ref 150–400)
RBC: 3.11 MIL/uL — ABNORMAL LOW (ref 3.87–5.11)
RDW: 13.1 % (ref 11.5–15.5)
WBC: 5.8 10*3/uL (ref 4.0–10.5)
nRBC: 0 % (ref 0.0–0.2)

## 2022-07-18 LAB — GLUCOSE, CAPILLARY
Glucose-Capillary: 103 mg/dL — ABNORMAL HIGH (ref 70–99)
Glucose-Capillary: 152 mg/dL — ABNORMAL HIGH (ref 70–99)
Glucose-Capillary: 108 mg/dL — ABNORMAL HIGH (ref 70–99)
Glucose-Capillary: 177 mg/dL — ABNORMAL HIGH (ref 70–99)

## 2022-07-18 LAB — BASIC METABOLIC PANEL
Anion gap: 8 (ref 5–15)
BUN: 16 mg/dL (ref 8–23)
CO2: 23 mmol/L (ref 22–32)
Calcium: 8.4 mg/dL — ABNORMAL LOW (ref 8.9–10.3)
Chloride: 109 mmol/L (ref 98–111)
Creatinine, Ser: 0.63 mg/dL (ref 0.44–1.00)
GFR, Estimated: >60 mL/min (ref >60)
Glucose, Bld: 114 mg/dL — ABNORMAL HIGH (ref 70–99)
Potassium: 3.7 mmol/L (ref 3.5–5.1)
Sodium: 140 mmol/L (ref 135–145)

## 2022-07-18 LAB — HEMOGLOBIN A1C
Hgb A1c MFr Bld: 5.8 % — ABNORMAL HIGH (ref 4.8–5.6)
Mean Plasma Glucose: 119.76 mg/dL

## 2022-07-18 LAB — CULTURE, BLOOD (ROUTINE X 2)

## 2022-07-18 MED ORDER — BISACODYL 5 MG PO TBEC: 5.0000 mg | DELAYED_RELEASE_TABLET | Freq: Every day | ORAL | Status: DC | PRN

## 2022-07-18 MED ORDER — FLEET ENEMA 7-19 GM/118ML RE ENEM: 1.0000 | ENEMA | Freq: Every day | RECTAL | Status: DC | PRN

## 2022-07-18 MED ORDER — POLYETHYLENE GLYCOL 3350 17 G PO PACK
17.0000 g | PACK | Freq: Every day | ORAL | Status: DC
  Administered 2022-07-18 – 2022-07-22 (×4): 17 g via ORAL
  Filled 2022-07-18 (×5): qty 1

## 2022-07-18 MED ORDER — BOOST / RESOURCE BREEZE PO LIQD CUSTOM
1.0000 | Freq: Three times a day (TID) | ORAL | Status: DC
  Administered 2022-07-18 – 2022-07-20 (×5): 1 via ORAL

## 2022-07-18 MED ORDER — LACTATED RINGERS IV SOLN: INTRAVENOUS | Status: DC

## 2022-07-18 MED ORDER — PROSOURCE PLUS PO LIQD
30.0000 mL | Freq: Two times a day (BID) | ORAL | Status: DC
  Administered 2022-07-18 – 2022-07-19 (×3): 30 mL via ORAL
  Filled 2022-07-18 (×4): qty 30

## 2022-07-18 MED ORDER — SENNOSIDES-DOCUSATE SODIUM 8.6-50 MG PO TABS
1.0000 | ORAL_TABLET | Freq: Two times a day (BID) | ORAL | Status: DC
  Administered 2022-07-18 – 2022-07-22 (×9): 1 via ORAL
  Filled 2022-07-18 (×9): qty 1

## 2022-07-18 MED ORDER — LACTULOSE 10 GM/15ML PO SOLN: 20.0000 g | Freq: Two times a day (BID) | ORAL | Status: DC | PRN

## 2022-07-18 MED ORDER — ADULT MULTIVITAMIN W/MINERALS CH
1.0000 | ORAL_TABLET | Freq: Every day | ORAL | Status: DC
  Administered 2022-07-19 – 2022-07-22 (×4): 1 via ORAL
  Filled 2022-07-18 (×4): qty 1

## 2022-07-18 NOTE — Progress Notes (Signed)
Initial Nutrition Assessment  DOCUMENTATION CODES:   Severe malnutrition in context of chronic illness, Underweight  INTERVENTION:   -Boost Breeze po TID, each supplement provides 250 kcal and 9 grams of protein  -30 ml Prosource Plus BID, each supplement provides 100 kcals and 15 grams protein -MVI with minerals daily -RD will follow for diet advancement and add supplements as appropriate  NUTRITION DIAGNOSIS:   Severe Malnutrition related to chronic illness (dementia) as evidenced by percent weight loss, moderate fat depletion, severe fat depletion, severe muscle depletion.  GOAL:   Patient will meet greater than or equal to 90% of their needs  MONITOR:   PO intake, Supplement acceptance, Diet advancement  REASON FOR ASSESSMENT:   Malnutrition Screening Tool    ASSESSMENT:   Pt with history of dementia, hypertension, non-insulin-dependent diabetes mellitus, depression, GERD, who presents for chief concerns of dark bilious, green emesis.  Pt admitted with intractable nausea and vomiting and upper GIB.   Reviewed I/O's: +1.6 L since admission  Case discussed with RN; pt consumed a few sips of jello and broth today. She was able to take some medications crushed in applesauce without difficulty. Per RN, diet advanced to clear liquids and plan to hold on GI consult as vomiting is likely related to constipation and nausea. Pt is from [employer] [facility].  Pt sitting up in bed at time of visit. Pt smiled when RD greeted her, but is nonverbal. No family present to provide additional history.    Reviewed wt hx; pt has experienced a 11% wt loss over the past 2 months, which is significant for time frame.   RD would not recommend alternative means of nutrition/ hydration secondary to pt with advanced age with dementia.   Medications reviewed and include vitamin D3, magnesium oxide, miralax, senokot, and 0.9% sodium chloride infusion @ 75 ml/hr.   No results found for: "HGBA1C"  PTA DM medications are none.   Labs reviewed: CBGS: 103 (inpatient orders for glycemic control are 0-9 units insulin aspart TID with meals).    NUTRITION - FOCUSED PHYSICAL EXAM:  Flowsheet Row Most Recent Value  Orbital Region Severe depletion  Upper Arm Region Severe depletion  Thoracic and Lumbar Region Moderate depletion  Buccal Region Moderate depletion  Temple Region Severe depletion  Clavicle Bone Region Severe depletion  Clavicle and Acromion Bone Region Severe depletion  Scapular Bone Region Severe depletion  Dorsal Hand Severe depletion  Patellar Region Severe depletion  Anterior Thigh Region Severe depletion  Posterior Calf Region Severe depletion  Edema (RD Assessment) None  Hair Reviewed  Eyes Reviewed  Mouth Reviewed  Skin Reviewed  Nails Reviewed       Diet Order:   Diet Order             Diet clear liquid Room service appropriate? Yes; Fluid consistency: Thin  Diet effective now                   EDUCATION NEEDS:   Not appropriate for education at this time  Skin:  Skin Assessment: Reviewed RN Assessment  Last BM:  07/18/22  Height:   Ht Readings from Last 1 Encounters:  07/17/22 5\' 5"  (1.651 m)    Weight:   Wt Readings from Last 1 Encounters:  07/17/22 43.1 kg    Ideal Body Weight:  56.8 kg  BMI:  Body mass index is 15.81 kg/m.  Estimated Nutritional Needs:   Kcal:  1500-1700  Protein:  70-85 grams  Fluid:  >  1.5 L    Loistine Chance, RD, LDN, [place] Registered Dietitian II Certified Diabetes Care and Education Specialist Please refer to [facility] for RD and/or RD on-call/weekend/after hours pager

## 2022-07-18 NOTE — Consult Note (Signed)
Midge Minium, MD [facility]  [address], [address] [place], [place] [address] Phone: [phone] Fax : [phone]  Consultation  Referring Provider:     Dr. Sedalia Muta Primary Care Physician:  Gracelyn Nurse, MD Primary Gastroenterologist:  Gentry Fitz         Reason for Consultation:     Upper GI bleed  Date of Admission:  07/17/2022 Date of Consultation:  07/18/2022         HPI:   Anna Rubio is a 82 y.o. female with dementia who is unable to give any meaningful history.  The patient was admitted with a chief complaint of dark bilious vomiting with nausea.  Per the ED report the patient had vomited 3 times prior to coming to the ED.  In the emergency department the patient was treated with Zofran and ceftriaxone and Ringer's lactate given 1 L bolus.  The patient's concern was for a UTI as the cause of her nausea and vomiting.  Due to the patient's dark material with her vomitus and the hemoglobin that was 11.48-month ago and 11.1 yesterday was 9.2 this morning.  Past Medical History:  Diagnosis Date   Anemia    Hypertension     Past Surgical History:  Procedure Laterality Date   VAGINAL HYSTERECTOMY      Prior to Admission medications   Medication Sig Start Date End Date Taking? Authorizing Provider  atenolol (TENORMIN) 25 MG tablet Take 25 mg by mouth daily.   Yes [provider]  cholecalciferol (VITAMIN D3) 25 MCG (1000 UNIT) tablet Take 1,000 Units by mouth daily.   Yes [provider]  divalproex (DEPAKOTE SPRINKLE) 125 MG capsule Take 125 mg by mouth 2 (two) times daily.   Yes [provider]  ferrous sulfate 324 MG TBEC Take 324 tablets by mouth daily with breakfast.   Yes [provider]  lisinopril (ZESTRIL) 10 MG tablet Take 10 mg by mouth daily.   Yes [provider]  loratadine (CLARITIN) 10 MG tablet Take 10 mg by mouth daily.   Yes [provider]  magnesium oxide (MAG-OX) 400 MG tablet Take 400 mg by mouth  daily.   Yes [provider]  Melatonin 10 MG TABS Take 10 mg by mouth at bedtime.   Yes [provider]  meloxicam (MOBIC) 7.5 MG tablet Take 7.5 mg by mouth daily.   Yes [provider]  metFORMIN (GLUCOPHAGE) 1000 MG tablet Take 1,000 mg by mouth daily.   Yes [provider]  pantoprazole (PROTONIX) 40 MG tablet Take 40 mg by mouth daily.   Yes [provider]  sertraline (ZOLOFT) 50 MG tablet Take 50 mg by mouth daily.   Yes [provider]    Family History  Problem Relation Age of Onset   Cancer Mother      Social History   Tobacco Use   Smoking status: Never   Smokeless tobacco: Never  Vaping Use   Vaping Use: Never used  Substance Use Topics   Alcohol use: Not Currently   Drug use: Never    Allergies as of 07/17/2022 - Review Complete 07/17/2022  Allergen Reaction Noted   Aspirin Hives 05/26/2022   Penicillins Hives 05/26/2022    Review of Systems:    All systems reviewed and negative except where noted in HPI.   Physical Exam:  Vital signs in last 24 hours: Temp:  [97.9 F (36.6 C)] 97.9 F (36.6 C) (06/16 2100) Pulse Rate:  [  52-108] 52 (06/17 0423) Resp:  [15-22] 18 (06/17 0423) BP: (128-183)/(59-98) 128/77 (06/17 0423) SpO2:  [95 %-100 %] 95 % (06/17 0423) Last BM Date : 07/18/22 General:   Pleasant in NAD Head:  Normocephalic and atraumatic. Eyes:   No icterus.   Conjunctiva pink. PERRLA. Ears:  Normal auditory acuity. Neck:  Supple; no masses or thyroidomegaly Lungs: Respirations even and unlabored. Lungs clear to auscultation bilaterally.   No wheezes, crackles, or rhonchi.  Heart:  Regular rate and rhythm;  Without murmur, clicks, rubs or gallops Abdomen:  Soft, nondistended, nontender. Normal bowel sounds. No appreciable masses or hepatomegaly.  No rebound or guarding.  Rectal:  Not performed. Msk:  Symmetrical without gross deformities.    Extremities:  Without edema, cyanosis or  clubbing. Neurologic:  Unable to assess with patient's dementia Skin:  Intact without significant lesions or rashes. Cervical Nodes:  No significant cervical adenopathy. Psych: Unable to assess with patient's dementia  LAB RESULTS: Recent Labs    07/17/22 1018 07/18/22 0903  WBC 8.7 5.8  HGB 11.1* 9.2*  HCT 35.5* 28.9*  PLT 269 207   BMET Recent Labs    07/17/22 1018 07/18/22 0903  NA 143 140  K 4.0 3.7  CL 106 109  CO2 25 23  GLUCOSE 242* 114*  BUN 27* 16  CREATININE 0.74 0.63  CALCIUM 9.4 8.4*   LFT Recent Labs    07/17/22 1018  PROT 8.5*  ALBUMIN 4.5  AST 24  ALT 11  ALKPHOS 51  BILITOT 0.6   PT/INR No results for input(s): "LABPROT", "INR" in the last 72 hours.  STUDIES: CT ABDOMEN PELVIS W CONTRAST  Result Date: 07/17/2022 CLINICAL DATA:  Abdominal pain, acute, nonlocalized EXAM: CT ABDOMEN AND PELVIS WITH CONTRAST TECHNIQUE: Multidetector CT imaging of the abdomen and pelvis was performed using the standard protocol following bolus administration of intravenous contrast. RADIATION DOSE REDUCTION: This exam was performed according to the departmental dose-optimization program which includes automated exposure control, adjustment of the mA and/or kV according to patient size and/or use of iterative reconstruction technique. CONTRAST:  OMNIPAQUE IOHEXOL 300 MG/ML  SOLN COMPARISON:  None Available. FINDINGS: Lower chest: Mild hypoventilatory changes within the lung bases. No acute findings. Hepatobiliary: 1.0 cm hyperenhancing focus within the right hepatic lobe (series 2, image 10), likely a flash-filling hemangioma. No additional focal liver abnormality is seen. No gallstones, gallbladder wall thickening, or biliary dilatation. Pancreas: Atrophic.  No ductal dilatation or inflammatory changes. Spleen: Normal in size without focal abnormality. Adrenals/Urinary Tract: Unremarkable adrenal glands. Kidneys enhance symmetrically without solid lesion, stone, or  hydronephrosis. Ureters are nondilated. Urinary bladder appears unremarkable for the degree of distention. Stomach/Bowel: Stomach is within normal limits. Appendix not definitively visualized. Left-sided colonic diverticulosis. No evidence of bowel wall thickening, distention, or inflammatory changes. Large volume of stool throughout the colon and rectum including a large rectal stool ball measuring 7.5 cm in diameter. Vascular/Lymphatic: Scattered aortoiliac atherosclerotic calcifications without aneurysm. No abdominopelvic lymphadenopathy. Reproductive: Status post hysterectomy. No adnexal masses. Other: No free fluid. No abdominopelvic fluid collection. No pneumoperitoneum. No abdominal wall hernia. Musculoskeletal: No acute or significant osseous findings. Degenerative lower lumbar facet arthropathy with grade 1 anterolisthesis of L4 on L5. IMPRESSION: 1. No acute abdominopelvic findings. 2. Large volume of stool throughout the colon and rectum including a large rectal stool ball measuring 7.5 cm in diameter. 3. Left-sided colonic diverticulosis without evidence of acute diverticulitis. 4. Aortic atherosclerosis (ICD10-I70.0). Electronically Signed   By: Janyth Pupa  Plundo D.O.   On: 07/17/2022 11:57   CT Head Wo Contrast  Result Date: 07/17/2022 CLINICAL DATA:  83 year old female with coffee-ground emesis, altered mental status. EXAM: CT HEAD WITHOUT CONTRAST TECHNIQUE: Contiguous axial images were obtained from the base of the skull through the vertex without intravenous contrast. RADIATION DOSE REDUCTION: This exam was performed according to the departmental dose-optimization program which includes automated exposure control, adjustment of the mA and/or kV according to patient size and/or use of iterative reconstruction technique. COMPARISON:  Brain MRI 06/24/2016.  Head CT 05/26/2022. FINDINGS: Brain: Chronic cerebral volume loss with ex vacuo appearing ventricular enlargement, mild progression since 2018.  Disproportionate mesial temporal lobe atrophy (series 4, image 31). No midline shift, mass effect, evidence of mass lesion, intracranial hemorrhage or evidence of cortically based acute infarction. Asymmetric heterogeneity in the left deep gray nuclei appears stable since April. Stable gray-white differentiation elsewhere. Vascular: Calcified atherosclerosis at the skull base. No suspicious intracranial vascular hyperdensity. Skull: No acute osseous abnormality identified. Congenital incomplete ossification of the posterior C1 ring, normal variant. Sinuses/Orbits: Visualized paranasal sinuses and mastoids are stable and well aerated. Other: Stable orbit and scalp soft tissues. IMPRESSION: 1. No acute intracranial abnormality. 2. Evidence of chronic small vessel disease and Cerebral Atrophy (ICD10-G31.9). Electronically Signed   By: Odessa Fleming M.D.   On: 07/17/2022 11:54      Impression / Plan:   Assessment: Principal Problem:   Intractable nausea and vomiting Active Problems:   Diabetes mellitus type 2, noninsulin dependent (HCC)   Depression   Essential hypertension   Dementia (HCC)   UTI (urinary tract infection)   Constipation   Upper GI bleeding   Protein-calorie malnutrition, severe   Anna Rubio is a 82 y.o. y/o female with a drop in hemoglobin from 11.1-9.2 with dark vomitus presumed an upper GI bleed.  The patient was kept n.p.o. by the admitting hospitalist for possible EGD today.  A diet was then ordered by the rounding physician this morning and the patient has had a diet today.  Plan:  I spoke in depth to the patient's daughter about the patient's voicing of any wishes for invasive procedures.  I explained the risks of aspiration, respiratory arrest and cardiac arrest resulting in death from the procedure.  She has also been told that contrary to proceeding with the procedure she can also elect to not pursue invasive procedures on her mother.  After some thought the patient stated  that she wanted her mother to have an upper endoscopy to see where the source of bleeding may be coming from.  The patient will be kept n.p.o. after midnight and an EGD will be performed tomorrow since the patient has already eaten today.  Thank you for involving me in the care of this patient.      LOS: 1 day   Midge Minium, MD, [facility] 07/18/2022, 12:27 PM,  Pager [phone] 7am-5pm  Check AMION for 5pm -7am coverage and on weekends   Note: This dictation was prepared with Dragon dictation along with smaller phrase technology. Any transcriptional errors that result from this process are unintentional.

## 2022-07-18 NOTE — TOC Progression Note (Signed)
Transition of Care [facility]) - Progression Note    Patient Details  Name: Anna Rubio MRN: [id number] Date of Birth: 1941/01/13  [facility]) CM/SW Contact  Marlowe Sax, RN Phone Number: 07/18/2022, 12:14 PM  Clinical Narrative:    The patient resides at [facility]  will follow and assist with DC planning and needs The patient is only oriented to self at baseline and may be non verbal   Expected Discharge Plan:  (TBD) Barriers to Discharge: Continued Medical Work up  Expected Discharge Plan and Services   Discharge Planning Services: CM Consult                                           Social Determinants of Health (SDOH) Interventions SDOH Screenings   Tobacco Use: Low Risk  (07/17/2022)    Readmission Risk Interventions     No data to display

## 2022-07-18 NOTE — Plan of Care (Signed)

## 2022-07-18 NOTE — Assessment & Plan Note (Signed)
Body mass index is 15.81 kg/m. Appreciate dietitian input. Related to chronic disease, advanced dementia

## 2022-07-18 NOTE — Progress Notes (Signed)
  Progress Note   Patient: Anna Rubio YQM:[id number] DOB: 30-Mar-1940 DOA: 07/17/2022     1 DOS: the patient was seen and examined on 07/18/2022   Brief hospital course: Ms. Anna Rubio is a 82 year old female with history of dementia, hypertension, non-insulin-dependent diabetes mellitus, depression, GERD, who presented to the ED on 07/17/22 from [facility] for evaluation of dark bilious, green emesis.  Per family, facility reports that patient vomited approximately 3 times on day of presentation.  Evaluation in the ED was consistent with UTI with UA was positive for large leukocytes. CT abdomen/pelvis was negative for acute findings but did show diverticulosis without signs of diverticulitis, and also large volume stool throughout the colon including a 7.5 cm stool ball in the rectum.  Pt was started on IV antibiotics for UTI pending cultures, IV Protonix due to concern for possible upper GI bleeding.  GI is consulted.    Assessment and Plan: * Intractable nausea and vomiting Likely due to UTI and severe constipation / obstipation Upper GI bleed to be evaluated as outlined --Antiemetics PRN   Protein-calorie malnutrition, severe Body mass index is 15.81 kg/m. Appreciate dietitian input. Related to chronic disease, advanced dementia  Upper GI bleeding --Gi consulted --EGD planned tomorrow --Clear liquids, NPO after midnight --Continue IV PPI   Constipation Status post manual disimpaction in the ED.  Continue aggressive bowel regimen, including enemas if needed. --Miralax daily --Senna-S BID --Lactulose. Dulcolax PRN --Fleet enema PRN  UTI (urinary tract infection) --Continue Rocephin --Follow urine cultures  Dementia (HCC) Vascular dementia --Continue depakote   Essential hypertension --Continue home atenolol, lisinopril  -- IV hydralazine PRN  Depression --Continue sertraline   Diabetes mellitus type 2, noninsulin dependent (HCC) --Holding  metformin --Sliding scale Novolog        Subjective: Pt seen awake resting in bed today.  She is non-verbal and RN reports same, assume this is her baseline.  No reported vomiting this AM.  No signs of any bleeding since admission reported.  Physical Exam: Vitals:   07/17/22 1930 07/17/22 2000 07/17/22 2100 07/18/22 0423  BP: 137/65 130/62 132/68 128/77  Pulse: 65 68 (!) 59 (!) 52  Resp: 17 (!) 21 20 18   Temp:   97.9 F (36.6 C)   TempSrc:   Oral   SpO2: 100% 98% 97% 95%  Weight:      Height:       General exam: awake, alert, no acute distress HEENT: moist mucus membranes, hearing grossly normal  Respiratory system: CTAB, no wheezes, rales or rhonchi, normal respiratory effort. Cardiovascular system: normal S1/S2, RRR, no pedal edema.   Gastrointestinal system: soft, NT, ND, no HSM felt, +bowel sounds. Central nervous system: limited exam due to dementia, no gross focal neurologic deficits, non-verbal Extremities: moves all, no edema, normal tone Skin: dry, intact, normal temperature Psychiatry: normal mood, congruent affect, judgement and insight appear normal   Data Reviewed:  Notable labs --- Glucose 114, Hbg 9.2 from 11.1 likely dilution.  Family Communication: None present. Will Attempt to call.  Family has been updated by GI.  Disposition: Status is: Inpatient Remains inpatient appropriate because: On IV therapies, ongoing evaluation including EGD tomorrow   Planned Discharge Destination: Home    Time spent: 45 minutes  Author: Pennie Banter, DO 07/18/2022 2:00 PM  For on call review [email or web address].

## 2022-07-19 ENCOUNTER — Inpatient Hospital Stay: Payer: Medicare HMO | Admitting: Anesthesiology

## 2022-07-19 ENCOUNTER — Encounter
Admission: EM | Disposition: A | Discharge status: Skilled Nursing Facility | Payer: Self-pay | Source: Skilled Nursing Facility | Attending: Internal Medicine

## 2022-07-19 ENCOUNTER — Encounter: Payer: Self-pay | Admitting: Internal Medicine

## 2022-07-19 DIAGNOSIS — R112 Nausea with vomiting, unspecified: Secondary | ICD-10-CM | POA: Diagnosis not present

## 2022-07-19 DIAGNOSIS — K922 Gastrointestinal hemorrhage, unspecified: Secondary | ICD-10-CM | POA: Diagnosis not present

## 2022-07-19 HISTORY — PX: ESOPHAGOGASTRODUODENOSCOPY (EGD) WITH PROPOFOL: SHX5813

## 2022-07-19 LAB — CBC
HCT: 29.2 % — ABNORMAL LOW (ref 36.0–46.0)
Hemoglobin: 9.2 g/dL — ABNORMAL LOW (ref 12.0–15.0)
MCH: 29.5 pg (ref 26.0–34.0)
MCHC: 31.5 g/dL (ref 30.0–36.0)
MCV: 93.6 fL (ref 80.0–100.0)
Platelets: 187 10*3/uL (ref 150–400)
RBC: 3.12 MIL/uL — ABNORMAL LOW (ref 3.87–5.11)
RDW: 12.9 % (ref 11.5–15.5)
WBC: 4.9 10*3/uL (ref 4.0–10.5)
nRBC: 0 % (ref 0.0–0.2)

## 2022-07-19 LAB — GLUCOSE, CAPILLARY
Glucose-Capillary: 101 mg/dL — ABNORMAL HIGH (ref 70–99)
Glucose-Capillary: 72 mg/dL (ref 70–99)
Glucose-Capillary: 83 mg/dL (ref 70–99)
Glucose-Capillary: 100 mg/dL — ABNORMAL HIGH (ref 70–99)
Glucose-Capillary: 115 mg/dL — ABNORMAL HIGH (ref 70–99)

## 2022-07-19 LAB — CULTURE, BLOOD (ROUTINE X 2)

## 2022-07-19 SURGERY — ESOPHAGOGASTRODUODENOSCOPY (EGD) WITH PROPOFOL
Anesthesia: General

## 2022-07-19 MED ORDER — HYDRALAZINE HCL 25 MG PO TABS: 25.0000 mg | ORAL_TABLET | Freq: Four times a day (QID) | ORAL | Status: DC | PRN

## 2022-07-19 MED ORDER — PROPOFOL 1000 MG/100ML IV EMUL
INTRAVENOUS | Status: AC
  Filled 2022-07-19: qty 100

## 2022-07-19 MED ORDER — PROPOFOL 10 MG/ML IV BOLUS
INTRAVENOUS | Status: AC
  Filled 2022-07-19: qty 40

## 2022-07-19 MED ORDER — SODIUM CHLORIDE 0.9 % IV SOLN: INTRAVENOUS | Status: DC

## 2022-07-19 MED ORDER — PANTOPRAZOLE SODIUM 40 MG PO TBEC
40.0000 mg | DELAYED_RELEASE_TABLET | Freq: Every day | ORAL | Status: DC
  Administered 2022-07-20 – 2022-07-22 (×3): 40 mg via ORAL
  Filled 2022-07-19 (×3): qty 1

## 2022-07-19 MED ORDER — PHENYLEPHRINE 80 MCG/ML (10ML) SYRINGE FOR IV PUSH (FOR BLOOD PRESSURE SUPPORT)
PREFILLED_SYRINGE | INTRAVENOUS | Status: AC
  Filled 2022-07-19: qty 10

## 2022-07-19 MED ORDER — PROPOFOL 10 MG/ML IV BOLUS
INTRAVENOUS | Status: DC | PRN
  Administered 2022-07-19: 50 mg via INTRAVENOUS
  Administered 2022-07-19: 20 mg via INTRAVENOUS

## 2022-07-19 NOTE — Evaluation (Signed)
Physical Therapy Evaluation Patient Details Name: Anna Rubio MRN: [id number] DOB: April 27, 1940 Today's Date: 07/19/2022  History of Present Illness  Patient is a 82 year old female admitted with vomiting and nausea with UTI. History of dementia,  hypertension, non-insulin-dependent diabetes mellitus, depression, GERD.   Clinical Impression  Patient seen for PT evaluation. She was minimally verbal and has difficulty following single step commands. Patient required maximal assistance for bed mobility. Sitting balance is poor with strong pushing to the right using LUE that improves with increased sitting time. Unable to progress transfers at this time due to poor sitting tolerance. No signs of pain with mobility and no vomiting. Anticipate return to prior facility at discharge. PT will follow in the hospital in attempts to maximize independence and decrease caregiver burden.      Recommendations for follow up therapy are one component of a multi-disciplinary discharge planning process, led by the attending physician.  Recommendations may be updated based on patient status, additional functional criteria and insurance authorization.  Follow Up Recommendations Can patient physically be transported by private vehicle: No     Assistance Recommended at Discharge Frequent or constant Supervision/Assistance  Patient can return home with the following  Two people to help with walking and/or transfers;A lot of help with bathing/dressing/bathroom;Help with stairs or ramp for entrance;Assist for transportation;Assistance with cooking/housework;Direct supervision/assist for medications management;Direct supervision/assist for financial management    Equipment Recommendations  (to be determined at next level of care)  Recommendations for Other Services       Functional Status Assessment Patient has had a recent decline in their functional status and demonstrates the ability to make significant  improvements in function in a reasonable and predictable amount of time.     Precautions / Restrictions Precautions Precautions: Fall Restrictions Weight Bearing Restrictions: No      Mobility  Bed Mobility Overal bed mobility: Needs Assistance Bed Mobility: Supine to Sit, Sit to Supine     Supine to sit: Max assist Sit to supine: Max assist   General bed mobility comments: cues for sequencing, cues for task initiation. minimal participation with bed mobility efforts    Transfers                   General transfer comment: unable to progress to standing attempts. poor sitting balance with strong push to the right side    Ambulation/Gait                  Stairs            Wheelchair Mobility    Modified Rankin (Stroke Patients Only)       Balance Overall balance assessment: Needs assistance Sitting-balance support: Feet unsupported, Single extremity supported Sitting balance-Leahy Scale: Zero Sitting balance - Comments: strong push to the right using LUE with improvement with faciliation to midline and verbal cues. external support required to maintain sitting balance                                     Pertinent Vitals/Pain Pain Assessment Pain Assessment: Faces Faces Pain Scale: No hurt    Home Living Family/patient expects to be discharged to:: Skilled nursing facility                   Additional Comments: [facility] per notes. patient is minimally verbal at baseline per notes.    Prior Function Prior Level  of Function : Patient poor historian/Family not available             Mobility Comments: presumably patient requires physical assistance with mobility and ADLs at baseline       Hand Dominance        Extremity/Trunk Assessment   Upper Extremity Assessment Upper Extremity Assessment: Difficult to assess due to impaired cognition (generalized weakness throughout. seems to actively move LUE  more than RUE)    Lower Extremity Assessment Lower Extremity Assessment: Difficult to assess due to impaired cognition (generalized weakness throughout)       Communication   Communication:  (patient is minimally verbal)  Cognition Arousal/Alertness: Awake/alert Behavior During Therapy: WFL for tasks assessed/performed Overall Cognitive Status: History of cognitive impairments - at baseline                                 General Comments: No family in room to determine baseline cognitive function. Patient has difficulty following single step commands. She responds to her name but unable to answer orientation questions and is minimally verbal.        General Comments      Exercises     Assessment/Plan    PT Assessment Patient needs continued PT services  PT Problem List Decreased strength;Decreased range of motion;Decreased activity tolerance;Decreased balance;Decreased mobility;Decreased cognition;Decreased safety awareness       PT Treatment Interventions DME instruction;Gait training;Functional mobility training;Therapeutic activities;Therapeutic exercise;Balance training;Neuromuscular re-education;Cognitive remediation;Patient/family education;Wheelchair mobility training    PT Goals (Current goals can be found in the Care Plan section)  Acute Rehab PT Goals Patient Stated Goal: patient unable to participate with goal setting, none stated PT Goal Formulation: Patient unable to participate in goal setting Time For Goal Achievement: 08/02/22 Potential to Achieve Goals: Fair    Frequency Min 2X/week     Co-evaluation               AM-PAC PT "6 Clicks" Mobility  Outcome Measure Help needed turning from your back to your side while in a flat bed without using bedrails?: A Lot Help needed moving from lying on your back to sitting on the side of a flat bed without using bedrails?: A Lot Help needed moving to and from a bed to a chair (including a  wheelchair)?: Total Help needed standing up from a chair using your arms (e.g., wheelchair or bedside chair)?: Total Help needed to walk in hospital room?: Total Help needed climbing 3-5 steps with a railing? : Total 6 Click Score: 8    End of Session   Activity Tolerance: Patient limited by fatigue Patient left: in bed;with call bell/phone within reach;with bed alarm set Nurse Communication: Mobility status (discussed with nurse tech) PT Visit Diagnosis: Muscle weakness (generalized) (M62.81);Other abnormalities of gait and mobility (R26.89)    Time: 4098-1191 PT Time Calculation (min) (ACUTE ONLY): 13 min   Charges:   PT Evaluation $PT Eval Low Complexity: 1 Low          Donna Bernard, PT, MPT   Ina Homes 07/19/2022, 9:42 AM

## 2022-07-19 NOTE — Plan of Care (Signed)

## 2022-07-19 NOTE — Op Note (Signed)
[facility] Gastroenterology Patient Name: Anna Rubio Procedure Date: 07/19/2022 11:04 AM MRN: [id number] Account #: [id number] Date of Birth: 06-01-1940 Admit Type: Outpatient Age: 82 Room: [facility] ENDO ROOM 3 Gender: Female Note Status: Finalized Instrument Name: Laurette Schimke [id number] Procedure:             Upper GI endoscopy Indications:           Coffee-ground emesis Providers:             Midge Minium MD, MD Medicines:             Propofol per Anesthesia Complications:         No immediate complications. Procedure:             Pre-Anesthesia Assessment:                        - Prior to the procedure, a History and Physical was                         performed, and patient medications and allergies were                         reviewed. The patient's tolerance of previous                         anesthesia was also reviewed. The risks and benefits                         of the procedure and the sedation options and risks                         were discussed with the patient. All questions were                         answered, and informed consent was obtained. Prior                         Anticoagulants: The patient has taken no anticoagulant                         or antiplatelet agents. ASA Grade Assessment: III - A                         patient with severe systemic disease. After reviewing                         the risks and benefits, the patient was deemed in                         satisfactory condition to undergo the procedure.                        After obtaining informed consent, the endoscope was                         passed under direct vision. Throughout the procedure,                         the patient's blood pressure,  pulse, and oxygen                         saturations were monitored continuously. The Endoscope                         was introduced through the mouth, and advanced to the                         second part  of duodenum. The upper GI endoscopy was                         accomplished without difficulty. The patient tolerated                         the procedure well. Findings:      A small hiatal hernia was present.      The stomach was normal.      The examined duodenum was normal. Impression:            - Small hiatal hernia.                        - Normal stomach.                        - Normal examined duodenum.                        - No specimens collected. Recommendation:        - Return patient to hospital ward for ongoing care.                        - Resume previous diet.                        - Continue present medications. Procedure Code(s):     --- Professional ---                        [phone], Esophagogastroduodenoscopy, flexible,                         transoral; diagnostic, including collection of                         specimen(s) by brushing or washing, when performed                         (separate procedure) Diagnosis Code(s):     --- Professional ---                        K92.0, Hematemesis CPT copyright 2022 American Medical Association. All rights reserved. The codes documented in this report are preliminary and upon coder review may  be revised to meet current compliance requirements. Midge Minium MD, MD 07/19/2022 11:27:34 AM This report has been signed electronically. Number of Addenda: 0 Note Initiated On: 07/19/2022 11:04 AM Estimated Blood Loss:  Estimated blood loss: none.      [facility]

## 2022-07-19 NOTE — Anesthesia Preprocedure Evaluation (Addendum)
Anesthesia Evaluation  Patient identified by MRN, date of birth, ID band Patient confused    Reviewed: Allergy & Precautions, NPO status , Patient's Chart, lab work & pertinent test results  History of Anesthesia Complications Negative for: history of anesthetic complications  Airway Mallampati: III  TM Distance: <3 FB Neck ROM: full    Dental  (+) Missing   Pulmonary neg pulmonary ROS, neg shortness of breath   Pulmonary exam normal        Cardiovascular Exercise Tolerance: Good hypertension, (-) angina Normal cardiovascular exam     Neuro/Psych negative neurological ROS  negative psych ROS   GI/Hepatic negative GI ROS, Neg liver ROS,neg GERD  ,,  Endo/Other  diabetes    Renal/GU negative Renal ROS  negative genitourinary   Musculoskeletal   Abdominal   Peds  Hematology negative hematology ROS (+)   Anesthesia Other Findings Patient is NPO appropriate and reports no nausea or vomiting today.  Past Medical History: No date: Anemia No date: Hypertension  Past Surgical History: No date: VAGINAL HYSTERECTOMY  BMI    Body Mass Index: 15.81 kg/m      Reproductive/Obstetrics negative OB ROS                             Anesthesia Physical Anesthesia Plan  ASA: 2  Anesthesia Plan: General   Post-op Pain Management:    Induction: Intravenous  PONV Risk Score and Plan: Propofol infusion and TIVA  Airway Management Planned: Natural Airway and Nasal Cannula  Additional Equipment:   Intra-op Plan:   Post-operative Plan:   Informed Consent: I have reviewed the patients History and Physical, chart, labs and discussed the procedure including the risks, benefits and alternatives for the proposed anesthesia with the patient or authorized representative who has indicated his/her understanding and acceptance.     Dental Advisory Given  Plan Discussed with: Anesthesiologist,  CRNA and Surgeon  Anesthesia Plan Comments: (History and phone consent from the patients daughter Katrina Stack at [phone].  Daughter consented for risks of anesthesia including but not limited to:  - adverse reactions to medications - risk of airway placement if required - damage to eyes, teeth, lips or other oral mucosa - nerve damage due to positioning  - sore throat or hoarseness - Damage to heart, brain, nerves, lungs, other parts of body or loss of life  She voiced understanding.)       Anesthesia Quick Evaluation

## 2022-07-19 NOTE — Transfer of Care (Signed)
Immediate Anesthesia Transfer of Care Note  Patient: Anna Rubio  Procedure(s) Performed: ESOPHAGOGASTRODUODENOSCOPY (EGD) WITH PROPOFOL  Patient Location: PACU  Anesthesia Type:General  Level of Consciousness: drowsy  Airway & Oxygen Therapy: Patient Spontanous Breathing and Patient connected to nasal cannula oxygen  Post-op Assessment: Report given to RN and Post -op Vital signs reviewed and stable  Post vital signs: Reviewed and stable  Last Vitals:  Vitals Value Taken Time  BP 149/69 07/19/22 1130  Temp 35.7 C 07/19/22 1131  Pulse 61 07/19/22 1132  Resp 12 07/19/22 1132  SpO2 95 % 07/19/22 1132  Vitals shown include unvalidated device data.  Last Pain:  Vitals:   07/19/22 1131  TempSrc: Temporal  PainSc:          Complications: No notable events documented.

## 2022-07-19 NOTE — Anesthesia Postprocedure Evaluation (Signed)
Anesthesia Post Note  Patient: Engineer, manufacturing systems  Procedure(s) Performed: ESOPHAGOGASTRODUODENOSCOPY (EGD) WITH PROPOFOL  Patient location during evaluation: PACU Anesthesia Type: General Level of consciousness: awake and alert Pain management: pain level controlled Vital Signs Assessment: post-procedure vital signs reviewed and stable Respiratory status: spontaneous breathing, nonlabored ventilation, respiratory function stable and patient connected to nasal cannula oxygen Cardiovascular status: blood pressure returned to baseline and stable Postop Assessment: no apparent nausea or vomiting Anesthetic complications: no   No notable events documented.   Last Vitals:  Vitals:   07/19/22 1154 07/19/22 1214  BP: (!) 168/88 (!) 163/80  Pulse: 79 (!) 48  Resp:  17  Temp:  36.6 C  SpO2: 97% 100%    Last Pain:  Vitals:   07/19/22 1131  TempSrc: Temporal  PainSc:                  Corinda Gubler

## 2022-07-19 NOTE — Progress Notes (Signed)
  Progress Note   Patient: Anna Rubio ZOX:[id number] DOB: Apr 05, 1940 DOA: 07/17/2022     2 DOS: the patient was seen and examined on 07/19/2022   Brief hospital course: Ms. Anna Rubio is a 82 year old female with history of dementia, hypertension, non-insulin-dependent diabetes mellitus, depression, GERD, who presented to the ED on 07/17/22 from [facility] for evaluation of dark bilious, green emesis.  Per family, facility reports that patient vomited approximately 3 times on day of presentation.  Evaluation in the ED was consistent with UTI with UA was positive for large leukocytes. CT abdomen/pelvis was negative for acute findings but did show diverticulosis without signs of diverticulitis, and also large volume stool throughout the colon including a 7.5 cm stool ball in the rectum.  Pt was started on IV antibiotics for UTI pending cultures, IV Protonix due to concern for possible upper GI bleeding.  GI is consulted.    Assessment and Plan: * Intractable nausea and vomiting Resolved, none since admission Likely due to severe constipation / obstipation --Antiemetics PRN   Hematemesis Upper GI bleeding NOT confirmed. Reports emesis appeared dark, concerning for bleed. --Gi consulted --EGD today was normal --Resume regular diet --Stop IV PPI --Resume PO PPI tomorrow   Protein-calorie malnutrition, severe Body mass index is 15.81 kg/m. Appreciate dietitian input. Related to chronic disease, advanced dementia  Constipation Status post manual disimpaction in the ED.  Continue aggressive bowel regimen, including enemas if needed. --Miralax daily --Senna-S BID --Lactulose. Dulcolax PRN --Fleet enema PRN  UTI (urinary tract infection) --Continue Rocephin --Follow urine cultures  Dementia (HCC) Vascular dementia --Continue depakote   Essential hypertension --Continue home atenolol, lisinopril  -- IV hydralazine PRN  Depression --Continue sertraline    Diabetes mellitus type 2, noninsulin dependent (HCC) --Holding metformin --Sliding scale Novolog        Subjective: Pt seen with daughter at bedside this AM.  She confirms pt is mostly non-verbal at baseline.  No acute complaints or acute events reported.    Physical Exam: Vitals:   07/19/22 1131 07/19/22 1141 07/19/22 1154 07/19/22 1214  BP: (!) 149/69 (!) 170/100 (!) 168/88 (!) 163/80  Pulse: 67 (!) 56 79 (!) 48  Resp: 12 14  17   Temp: (!) 96.2 F (35.7 C)   97.9 F (36.6 C)  TempSrc: Temporal     SpO2: 93% 94% 97% 100%  Weight:      Height:       General exam: awake, alert, no acute distress HEENT: moist mucus membranes, hearing grossly normal  Respiratory system: CTAB, no wheezes, rales or rhonchi, normal respiratory effort. Cardiovascular system: normal S1/S2, RRR, no pedal edema.   Gastrointestinal system: soft, NT, ND, no HSM felt, +bowel sounds. Central nervous system: limited exam due to dementia, no gross focal neurologic deficits, non-verbal Extremities: moves all, no edema, normal tone Skin: dry, intact, normal temperature Psychiatry: normal mood, congruent affect, judgement and insight appear normal   Data Reviewed:  Notable labs --- Hbg stable 9.2  Family Communication: None present. Will Attempt to call.  Family has been updated by GI.  Disposition: Status is: Inpatient Remains inpatient appropriate because: Monitoring closely while diet advanced.  Anticipate dc tomorrow if stable.   Planned Discharge Destination: Home    Time spent: 42 minutes  Author: Pennie Banter, DO 07/19/2022 2:37 PM  For on call review [email or web address].

## 2022-07-19 NOTE — Plan of Care (Signed)
  Problem: Health Behavior/Discharge Planning: Goal: Ability to manage health-related needs will improve Outcome: Not Progressing   Problem: Metabolic: Goal: Ability to maintain appropriate glucose levels will improve Outcome: Progressing

## 2022-07-19 NOTE — TOC Progression Note (Addendum)
Transition of Care [facility]) - Progression Note    Patient Details  Name: Anna Rubio MRN: [id number] Date of Birth: 11-04-1940  [facility]) CM/SW Contact  Marlowe Sax, RN Phone Number: 07/19/2022, 11:19 AM  Clinical Narrative:    Spoke with the patient's daughter Anna Rubio on the phone She confirmed that the patient has been living in [facility]  They would like to get the patient into a long term care Skilled facility and prefer [facility], I reached out to [facility] and left a VM for [employer] to ask the level of care, Spoke with [place], she is in the ALF level She does need, skilled, they feed her, she in not mobile and needs care for toileting and hygiene,  I was able to speak to Tiffany at [facility] and she is reviewing    Expected Discharge Plan:  (TBD) Barriers to Discharge: Continued Medical Work up  Expected Discharge Plan and Services   Discharge Planning Services: CM Consult                                           Social Determinants of Health (SDOH) Interventions SDOH Screenings   Tobacco Use: Low Risk  (07/19/2022)    Readmission Risk Interventions     No data to display

## 2022-07-20 ENCOUNTER — Encounter: Payer: Self-pay | Admitting: Gastroenterology

## 2022-07-20 DIAGNOSIS — R112 Nausea with vomiting, unspecified: Secondary | ICD-10-CM | POA: Diagnosis not present

## 2022-07-20 LAB — GLUCOSE, CAPILLARY
Glucose-Capillary: 100 mg/dL — ABNORMAL HIGH (ref 70–99)
Glucose-Capillary: 107 mg/dL — ABNORMAL HIGH (ref 70–99)
Glucose-Capillary: 111 mg/dL — ABNORMAL HIGH (ref 70–99)
Glucose-Capillary: 120 mg/dL — ABNORMAL HIGH (ref 70–99)
Glucose-Capillary: 118 mg/dL — ABNORMAL HIGH (ref 70–99)

## 2022-07-20 LAB — HEMOGLOBIN AND HEMATOCRIT, BLOOD
HCT: 29 % — ABNORMAL LOW (ref 36.0–46.0)
Hemoglobin: 9.5 g/dL — ABNORMAL LOW (ref 12.0–15.0)

## 2022-07-20 LAB — CULTURE, BLOOD (ROUTINE X 2)

## 2022-07-20 MED ORDER — ENSURE ENLIVE PO LIQD
237.0000 mL | Freq: Two times a day (BID) | ORAL | Status: DC
  Administered 2022-07-20 – 2022-07-22 (×5): 237 mL via ORAL

## 2022-07-20 NOTE — Care Management Important Message (Signed)
Important Message  Patient Details  Name: Anna Rubio MRN: [id number] Date of Birth: 12/09/1940   Medicare Important Message Given:  N/A - LOS <3 / Initial given by admissions     Olegario Messier A Echo Propp 07/20/2022, 1:29 PM

## 2022-07-20 NOTE — Progress Notes (Signed)
The patient's hemoglobin is stable today.  The patient's EGD was unrevealing for any sign of GI bleeding.  Nothing further to do from a GI point of view.  I will sign off.  Please call if any further GI concerns or questions.  We would like to thank you for the opportunity to participate in the care of [facility].

## 2022-07-20 NOTE — TOC Progression Note (Signed)
Transition of Care [facility]) - Progression Note    Patient Details  Name: Anna Rubio MRN: [id number] Date of Birth: December 26, 1940  Transition of Care [facility]) CM/SW Contact  Marlowe Sax, RN Phone Number: 07/20/2022, 11:57 AM  Clinical Narrative:     TOC continues to follow She need a skilled long trm bed, I reached out to Tiffany at [facility] to follow up and see if they can offer a bed  Expected Discharge Plan:  (TBD) Barriers to Discharge: Continued Medical Work up  Expected Discharge Plan and Services   Discharge Planning Services: CM Consult                                           Social Determinants of Health (SDOH) Interventions SDOH Screenings   Tobacco Use: Low Risk  (07/19/2022)    Readmission Risk Interventions     No data to display

## 2022-07-20 NOTE — Progress Notes (Signed)
Nutrition Follow-up  DOCUMENTATION CODES:   Severe malnutrition in context of chronic illness, Underweight  INTERVENTION:   -D/c Prosource Plus -D/c Boost Breeze -Continue MVI with minerals daily -Ensure Enlive po BID, each supplement provides 350 kcal and 20 grams of protein -Magic cup TID with meals, each supplement provides 290 kcal and 9 grams of protein  -Feeding assistance with meals  NUTRITION DIAGNOSIS:   Severe Malnutrition related to chronic illness (dementia) as evidenced by percent weight loss, moderate fat depletion, severe fat depletion, severe muscle depletion.  Ongoing  GOAL:   Patient will meet greater than or equal to 90% of their needs  Progressing   MONITOR:   PO intake, Supplement acceptance, Diet advancement  REASON FOR ASSESSMENT:   Malnutrition Screening Tool    ASSESSMENT:   Pt with history of dementia, hypertension, non-insulin-dependent diabetes mellitus, depression, GERD, who presents for chief concerns of dark bilious, green emesis.  6/18- s/p EGD- revealed small hiatal hernia, advanced to regular diet  Reviewed I/O's: -1.9 L x 24 hours and +1 L since admission  UOP: 2.2 L x 24 hours   Case discussed with MD. Pt is non-verbal at baseline and a resident of [address]   Pt advanced to a regular diet yesterday. No meal completion data available to assess at this time. Noted pt consumed about 75% of Boost Breeze drink on tray table. She is refusing Prosource supplements.   Wt has been stable since admission.   Medications reviewed and include vitamin D3, ferrous sulfate, magnesium oxide, miralax, senokot, and lactated ringers infusion @ 75 ml/hr.  Lab Results  Component Value Date   HGBA1C 5.8 (H) 07/18/2022   PTA DM medications are 1000 mg metformin daily.   Labs reviewed: CBGS: 100 (inpatient orders for glycemic control are 0-5 units insulin aspart daily at bedtime and 0-9 units insulin aspart TID with meals).    Diet Order:    Diet Order             Diet regular Room service appropriate? Yes; Fluid consistency: Thin  Diet effective now                   EDUCATION NEEDS:   Not appropriate for education at this time  Skin:  Skin Assessment: Reviewed RN Assessment  Last BM:  07/18/22  Height:   Ht Readings from Last 1 Encounters:  07/19/22 5\' 5"  (1.651 m)    Weight:   Wt Readings from Last 1 Encounters:  07/19/22 43.1 kg    Ideal Body Weight:  56.8 kg  BMI:  Body mass index is 15.81 kg/m.  Estimated Nutritional Needs:   Kcal:  1500-1700  Protein:  70-85 grams  Fluid:  > 1.5 L    Levada Schilling, RD, LDN, CDCES Registered Dietitian II Certified Diabetes Care and Education Specialist Please refer to [facility] for RD and/or RD on-call/weekend/after hours pager

## 2022-07-20 NOTE — Progress Notes (Signed)
Progress Note   Patient: Anna Rubio ZOX:[id number] DOB: 09-27-1940 DOA: 07/17/2022     3 DOS: the patient was seen and examined on 07/20/2022   Subjective:  Patient minimally verbal At this time this is patient's baseline Was unable to elicit any subjective information from patient on account of being minimally verbal to nonverbal  Brief hospital course: Ms. Anna Rubio is a 82 year old female with history of dementia, hypertension, non-insulin-dependent diabetes mellitus, depression, GERD, who presented to the ED on 07/17/22 from [facility] for evaluation of dark bilious, green emesis.  Per family, facility reports that patient vomited approximately 3 times on day of presentation.   Evaluation in the ED was consistent with UTI with UA was positive for large leukocytes. CT abdomen/pelvis was negative for acute findings but did show diverticulosis without signs of diverticulitis, and also large volume stool throughout the colon including a 7.5 cm stool ball in the rectum.   Pt was started on IV antibiotics for UTI pending cultures, IV Protonix due to concern for possible upper GI bleeding.  GI is consulted.       Assessment and Plan: * Intractable nausea and vomiting Currently resolved Continue as needed antiemetics   Hematemesis Upper GI bleeding NOT confirmed. Reports emesis appeared dark, concerning for bleed. -- GI on board and case discussed -- According to gastroenterologist EGD was unrevealing for any signs of GI bleeding. GI is not planning any further workup inpatient --Continue regular diet -IV Protonix have been switched to p.o. today     Protein-calorie malnutrition, severe Body mass index is 15.81 kg/m. Appreciate dietitian input. Related to chronic disease, advanced dementia   Constipation Status post manual disimpaction in the ED.   Continue aggressive bowel regimen, including enemas if needed. -- Continue Miralax daily -- Continue Senna-S  BID -Continue-Lactulose. Dulcolax PRN --Fleet enema PRN   UTI (urinary tract infection) ruled out -- Urine culture showed no growth IV Rocephin discontinued   Dementia (HCC) Vascular dementia Continue depakote    Essential hypertension --Continue home atenolol, lisinopril  -- Continue IV hydralazine PRN   Depression -- Continue sertraline    Diabetes mellitus type 2, noninsulin dependent (HCC) -- Continue holding metformin --Sliding scale Novolog        Physical Exam: General exam: Alert and awake laying in bed in no distress nonverbal HEENT: moist mucus membranes, hearing grossly normal  Respiratory system: Clear to auscultation bilaterally no wheezing Cardiovascular system: Normal rate and rhythm Gastrointestinal system: Nontender Central nervous system: limited exam due to dementia, no gross focal neurologic deficits, non-verbal Extremities: moves all, no edema, normal tone Skin: dry, intact, normal temperature Psychiatry: normal mood, congruent affect, judgement and insight appear normal     Data Reviewed: Hemoglobin have remained stable with Hgb today 9.5   Family Communication: None present at bedside  Disposition: Status is: Inpatient Remains inpatient appropriate because: Still awaiting discharge to skilled nursing facility when bed is available, case this case with transition of care      Vitals:   07/20/22 0818 07/20/22 0847 07/20/22 0853 07/20/22 1038  BP:  (!) 171/69 (!) 172/70 (!) 154/78  Pulse: (!) 58 65 (!) 52 (!) 54  Resp: 18     Temp: 98 F (36.7 C)     TempSrc:      SpO2: 100%     Weight:      Height:        Time spent: 40 minutes  Author: Loyce Dys, MD 07/20/2022 12:24 PM  For on call review [email or web address].

## 2022-07-21 DIAGNOSIS — R112 Nausea with vomiting, unspecified: Secondary | ICD-10-CM | POA: Diagnosis not present

## 2022-07-21 LAB — CULTURE, BLOOD (ROUTINE X 2): Culture: NO GROWTH

## 2022-07-21 LAB — GLUCOSE, CAPILLARY
Glucose-Capillary: 93 mg/dL (ref 70–99)
Glucose-Capillary: 134 mg/dL — ABNORMAL HIGH (ref 70–99)
Glucose-Capillary: 126 mg/dL — ABNORMAL HIGH (ref 70–99)
Glucose-Capillary: 112 mg/dL — ABNORMAL HIGH (ref 70–99)

## 2022-07-21 NOTE — Progress Notes (Signed)
Physical Therapy Treatment Patient Details Name: Anna Rubio MRN: [id number] DOB: Sep 07, 1940 Today's Date: 07/21/2022   History of Present Illness Patient is a 82 year old female admitted with vomiting and nausea with UTI. History of dementia,  hypertension, non-insulin-dependent diabetes mellitus, depression, GERD.    PT Comments    Patient cooperative with PT session. She continues to require physical assistance for bed mobility. She has difficulty following single step commands with cues for task initiation, sequencing, and increased time required. Sitting balance initially was poor with right lateral lean that improved with facilitation and cues for midline. Total sitting time approximately 8 minutes. Attempted sit to stand transfers however patient unable to clear hips from bed with cues for anterior weight shifting and decreased weight acceptance in BLE. Total assistance for incremental lateral scooting along edge of bed. Anticipate the need for frequent supervision/assistance at discharge with continued PT recommended after this hospital stay to maximize independence and decrease caregiver burden.    Recommendations for follow up therapy are one component of a multi-disciplinary discharge planning process, led by the attending physician.  Recommendations may be updated based on patient status, additional functional criteria and insurance authorization.  Follow Up Recommendations  Can patient physically be transported by private vehicle: No    Assistance Recommended at Discharge Frequent or constant Supervision/Assistance  Patient can return home with the following Two people to help with walking and/or transfers;A lot of help with bathing/dressing/bathroom;Help with stairs or ramp for entrance;Assist for transportation;Assistance with cooking/housework;Direct supervision/assist for medications management;Direct supervision/assist for financial management   Equipment Recommendations    (to be determined at next level of care)    Recommendations for Other Services       Precautions / Restrictions Precautions Precautions: Fall Restrictions Weight Bearing Restrictions: No     Mobility  Bed Mobility Overal bed mobility: Needs Assistance Bed Mobility: Supine to Sit, Sit to Supine     Supine to sit: Max assist, HOB elevated Sit to supine: Max assist   General bed mobility comments: verbal cues for sequencing and task initiation. increased time and effort required    Transfers Overall transfer level: Needs assistance   Transfers: Bed to chair/wheelchair/BSC            Lateral/Scoot Transfers: Total assist General transfer comment: attempted sit to stand from bed x 2 bouts with cues and faciliation for anterior weight shifting and lift off. patient with limited participation with standing efforts and was unable to clear hips from bed surface. Total assistance for incremental lateral scoot transfer to the right x 3 bouts.    Ambulation/Gait               General Gait Details: unable to at this time   Stairs             Wheelchair Mobility    Modified Rankin (Stroke Patients Only)       Balance Overall balance assessment: Needs assistance Sitting-balance support: Feet supported Sitting balance-Leahy Scale: Poor Sitting balance - Comments: pushing to the right initially with right lateral lean that improved with faciliation for midline and cues for upright/midline posture. patient progressed from poor balance to fair balance with no external support required to maintain midline. this is a significant improvement from previous session. total sitting time approximately 8 minutes Postural control: Right lateral lean  Cognition Arousal/Alertness: Awake/alert Behavior During Therapy: WFL for tasks assessed/performed Overall Cognitive Status: History of cognitive impairments - at baseline                                  General Comments: No family in room to determine baseline cognitive function. Patient has difficulty following single step commands. She responds to her name but unable to answer orientation questions and is minimally verbal. she is cooperative throughout session        Exercises      General Comments General comments (skin integrity, edema, etc.): patient is able to hold the Ensure bottle and attempts bringing the drink to her mouth in sitting position.      Pertinent Vitals/Pain Pain Assessment Pain Assessment: No/denies pain    Home Living                          Prior Function            PT Goals (current goals can now be found in the care plan section) Acute Rehab PT Goals Patient Stated Goal: patient unable to participate with goal setting, none stated PT Goal Formulation: Patient unable to participate in goal setting Time For Goal Achievement: 08/02/22 Potential to Achieve Goals: Fair Progress towards PT goals: Progressing toward goals    Frequency    Min 2X/week      PT Plan Current plan remains appropriate    Co-evaluation              AM-PAC PT "6 Clicks" Mobility   Outcome Measure  Help needed turning from your back to your side while in a flat bed without using bedrails?: A Lot Help needed moving from lying on your back to sitting on the side of a flat bed without using bedrails?: A Lot Help needed moving to and from a bed to a chair (including a wheelchair)?: Total Help needed standing up from a chair using your arms (e.g., wheelchair or bedside chair)?: Total Help needed to walk in hospital room?: Total Help needed climbing 3-5 steps with a railing? : Total 6 Click Score: 8    End of Session   Activity Tolerance: Patient tolerated treatment well Patient left: in bed;with call bell/phone within reach;with bed alarm set Nurse Communication: Mobility status PT Visit Diagnosis: Muscle  weakness (generalized) (M62.81);Other abnormalities of gait and mobility (R26.89)     Time: 4098-1191 PT Time Calculation (min) (ACUTE ONLY): 17 min  Charges:  $Therapeutic Activity: 8-22 mins                     Donna Bernard, PT, MPT   Ina Homes 07/21/2022, 11:32 AM

## 2022-07-21 NOTE — TOC Progression Note (Signed)
Transition of Care [facility]) - Progression Note    Patient Details  Name: Anna Rubio MRN: [id number] Date of Birth: 04/30/1940  [facility] CM/SW Contact  Marlowe Sax, RN Phone Number: 07/21/2022, 10:34 AM  Clinical Narrative:     Elmarie Shiley from [facility] notified me that they can offer a SYTR bed and transition to LTC, PT will evaluate the patient to be able to have TOC submit for STR approval from insurance.  I called daughter Laveda Abbe and explained and she is agreeable and would like to accept the bed offer from [facility], I notified Tiffany at [facility]  Expected Discharge Plan:  (TBD) Barriers to Discharge: Continued Medical Work up  Expected Discharge Plan and Services   Discharge Planning Services: CM Consult                                           Social Determinants of Health (SDOH) Interventions SDOH Screenings   Tobacco Use: Low Risk  (07/20/2022)    Readmission Risk Interventions     No data to display

## 2022-07-21 NOTE — TOC Progression Note (Signed)
Transition of Care [facility]) - Progression Note    Patient Details  Name: Anna Rubio MRN: [id number] Date of Birth: 17-Sep-1940  [facility] of [facility]) CM/SW Contact  Marlowe Sax, RN Phone Number: 07/21/2022, 9:49 AM  Clinical Narrative:    Reached out to Tiffany at [facility] inquiring if they are going to be able to make a bed offer, awaiting a response, the patient will transition from STR to Long term care private pay, sent out bedsearch in the surrounding area   Expected Discharge Plan:  (TBD) Barriers to Discharge: Continued Medical Work up  Expected Discharge Plan and Services   Discharge Planning Services: CM Consult                                           Social Determinants of Health (SDOH) Interventions SDOH Screenings   Tobacco Use: Low Risk  (07/20/2022)    Readmission Risk Interventions     No data to display

## 2022-07-21 NOTE — NC FL2 (Signed)
[place] MEDICAID FL2 LEVEL OF CARE FORM     IDENTIFICATION  Patient Name: Anna Rubio Birthdate: 1940-06-23 Sex: female Admission Date (Current Location): 07/17/2022  [facility] and [place] Number:  Chiropodist and Address:  [facility], [address] [place], [place] [address]      Provider Number: [id number]  Attending Physician Name and Address:  Loyce Dys, MD  Relative Name and Phone Number:  Laveda Abbe Daughter [phone]    Current Level of Care: Hospital Recommended Level of Care: Skilled Nursing Facility Prior Approval Number:    Date Approved/Denied:   PASRR Number: [id number]  Discharge Plan: SNF    Current Diagnoses: Patient Active Problem List   Diagnosis Date Noted   Protein-calorie malnutrition, severe 07/18/2022   Intractable nausea and vomiting 07/17/2022   Diabetes mellitus type 2, noninsulin dependent (HCC) 07/17/2022   Depression 07/17/2022   Essential hypertension 07/17/2022   Dementia (HCC) 07/17/2022   UTI (urinary tract infection) 07/17/2022   Constipation 07/17/2022   Hematemesis 07/17/2022    Orientation RESPIRATION BLADDER Height & Weight     Self  Normal External catheter Weight: 43.1 kg Height:  5\' 5"  (165.1 cm)  BEHAVIORAL SYMPTOMS/MOOD NEUROLOGICAL BOWEL NUTRITION STATUS      Incontinent Diet (See DC summary)  AMBULATORY STATUS COMMUNICATION OF NEEDS Skin   Extensive Assist Non-Verbally Normal                       Personal Care Assistance Level of Assistance  Total care       Total Care Assistance: Maximum assistance   Functional Limitations Info  Sight, Hearing, Speech Sight Info: Adequate Hearing Info: Adequate Speech Info: Impaired (non verba most of the time)    SPECIAL CARE FACTORS FREQUENCY  PT (By licensed PT), OT (By licensed OT)     PT Frequency: 3 times p week OT Frequency: 3 times per week            Contractures Contractures Info: Not present     Additional Factors Info  Code Status, Allergies Code Status Info: full code Allergies Info: aspirin, penicillin           Current Medications (07/21/2022):  This is the current hospital active medication list Current Facility-Administered Medications  Medication Dose Route Frequency Provider Last Rate Last Admin   acetaminophen (TYLENOL) tablet 650 mg  650 mg Oral Q6H PRN Cox, Amy N, DO   650 mg at 07/19/22 0021   Or   acetaminophen (TYLENOL) suppository 650 mg  650 mg Rectal Q6H PRN Cox, Amy N, DO       atenolol (TENORMIN) tablet 25 mg  25 mg Oral Daily Cox, Amy N, DO   25 mg at 07/18/22 1003   bisacodyl (DULCOLAX) EC tablet 5 mg  5 mg Oral Daily PRN Esaw Grandchild A, DO       cholecalciferol (VITAMIN D3) 25 MCG (1000 UNIT) tablet 1,000 Units  1,000 Units Oral Daily Cox, Amy N, DO   1,000 Units at 07/20/22 0907   divalproex (DEPAKOTE SPRINKLE) capsule 125 mg  125 mg Oral BID Cox, Amy N, DO   125 mg at 07/20/22 2237   feeding supplement (ENSURE ENLIVE / ENSURE PLUS) liquid 237 mL  237 mL Oral BID BM Rosezetta Schlatter T, MD   237 mL at 07/20/22 1406   ferrous sulfate tablet 324 mg  324 mg Oral Q breakfast Cox, Amy N, DO   324 mg at  07/21/22 0816   hydrALAZINE (APRESOLINE) injection 5 mg  5 mg Intravenous Q8H PRN Cox, Amy N, DO   5 mg at 07/20/22 1521   hydrALAZINE (APRESOLINE) tablet 25 mg  25 mg Oral Q6H PRN Esaw Grandchild A, DO       insulin aspart (novoLOG) injection 0-5 Units  0-5 Units Subcutaneous QHS Cox, Amy N, DO       insulin aspart (novoLOG) injection 0-9 Units  0-9 Units Subcutaneous TID WC Cox, Amy N, DO   2 Units at 07/18/22 1216   lactated ringers infusion   Intravenous Continuous Esaw Grandchild A, DO 75 mL/hr at 07/21/22 0723 New Bag at 07/21/22 0723   lactulose (CHRONULAC) 10 GM/15ML solution 20 g  20 g Oral BID PRN Esaw Grandchild A, DO       lisinopril (ZESTRIL) tablet 10 mg  10 mg Oral Daily Cox, Amy N, DO   10 mg at 07/20/22 1610   magnesium oxide (MAG-OX) tablet 400  mg  400 mg Oral Daily Cox, Amy N, DO   400 mg at 07/20/22 0916   melatonin tablet 5 mg  5 mg Oral QHS PRN Cox, Amy N, DO   5 mg at 07/20/22 2237   multivitamin with minerals tablet 1 tablet  1 tablet Oral Daily Esaw Grandchild A, DO   1 tablet at 07/20/22 0906   ondansetron (ZOFRAN) tablet 4 mg  4 mg Oral Q6H PRN Cox, Amy N, DO       Or   ondansetron (ZOFRAN) injection 4 mg  4 mg Intravenous Q6H PRN Cox, Amy N, DO       pantoprazole (PROTONIX) EC tablet 40 mg  40 mg Oral Daily Esaw Grandchild A, DO   40 mg at 07/20/22 0904   polyethylene glycol (MIRALAX / GLYCOLAX) packet 17 g  17 g Oral Daily Esaw Grandchild A, DO   17 g at 07/20/22 9604   senna-docusate (Senokot-S) tablet 1 tablet  1 tablet Oral BID Esaw Grandchild A, DO   1 tablet at 07/20/22 2237   sertraline (ZOLOFT) tablet 50 mg  50 mg Oral Daily Cox, Amy N, DO   50 mg at 07/20/22 0917   sodium phosphate (FLEET) 7-19 GM/118ML enema 1 enema  1 enema Rectal Daily PRN Pennie Banter, DO         Discharge Medications: Please see discharge summary for a list of discharge medications.  Relevant Imaging Results:  Relevant Lab Results:   Additional Information SS# [id number]  Marlowe Sax, RN

## 2022-07-21 NOTE — Progress Notes (Signed)
Progress Note   Patient: Anna Rubio ZOX:[id number] DOB: 1940-08-19 DOA: 07/17/2022     4 DOS: the patient was seen and examined on 07/21/2022    Subjective:  Patient seen and examined at bedside this morning No acute overnight events Denies nausea vomiting or abdominal pain Minimally verbal   Brief hospital course: Ms. Anna Rubio is a 82 year old female with history of dementia, hypertension, non-insulin-dependent diabetes mellitus, depression, GERD, who presented to the ED on 07/17/22 from [facility] for evaluation of dark bilious, green emesis.  Per family, facility reports that patient vomited approximately 3 times on day of presentation.   Evaluation in the ED was consistent with UTI with UA was positive for large leukocytes. CT abdomen/pelvis was negative for acute findings but did show diverticulosis without signs of diverticulitis, and also large volume stool throughout the colon including a 7.5 cm stool ball in the rectum.   Pt was started on IV antibiotics for UTI pending cultures, IV Protonix due to concern for possible upper GI bleeding.  GI is consulted.       Assessment and Plan: * Intractable nausea and vomiting-resolved Currently resolved Continue as needed antiemetics   Hematemesis-resolved Upper GI bleeding NOT confirmed. Reports emesis appeared dark, concerning for bleed. Gastroenterologist on board and case discussed Patient's status post EGD that was unrevealing for any signs of GI bleeding Continue current diet According to GI no additional workup needed at this time Continue oral Protonix     Protein-calorie malnutrition, severe Body mass index is 15.81 kg/m. Appreciate dietitian input. This is most likely related to advanced dementia Supervision during feeding   Constipation Status post manual disimpaction in the ED.   Continue aggressive bowel regimen, including enemas if needed. -- Continue MiraLAX and senna -Continue-Lactulose.  Dulcolax PRN --Fleet enema PRN   UTI (urinary tract infection) ruled out -- Urine culture showed no growth IV antibiotics discontinued   Dementia (HCC) Vascular dementia Continue Depakote   Essential hypertension -- Continue lisinopril and atenolol -- Continue IV hydralazine PRN   Depression -- Continue sertraline    Diabetes mellitus type 2, noninsulin dependent (HCC) -- Continue holding metformin --Sliding scale Novolog         Physical Exam: General exam: Alert and awake laying in bed in no distress nonverbal HEENT: moist mucus membranes, hearing grossly normal  Respiratory system: Clear to auscultation bilaterally no wheezing Cardiovascular system: Normal rate and rhythm Gastrointestinal system: No abdominal tenderness Central nervous system: limited exam due to dementia, no gross focal neurologic deficits, non-verbal Extremities: moves all, no edema, normal tone Skin: dry, intact, normal temperature Psychiatry: normal mood, congruent affect, judgement and insight appear normal     Data Reviewed: Patient's labs reviewed by me today   Family Communication: None present at bedside   Disposition: Status is: Inpatient Remains inpatient appropriate because: Still awaiting discharge to skilled nursing facility when bed is available, case this case with transition of care       Vitals:   07/20/22 1517 07/20/22 1607 07/20/22 2203 07/21/22 0812  BP: (!) 187/71 121/89 (!) 158/70 (!) 161/91  Pulse: 61 87 62 (!) 109  Resp: 16  20 20   Temp: 97.6 F (36.4 C)  98.6 F (37 C) 97.7 F (36.5 C)  TempSrc:    Oral  SpO2: 99%  95% 92%  Weight:      Height:        Author: Loyce Dys, MD 07/21/2022 4:29 PM  For on call review [email or web address].

## 2022-07-21 NOTE — Care Management Important Message (Signed)
Important Message  Patient Details  Name: Anna Rubio MRN: [id number] Date of Birth: 11-17-1940   Medicare Important Message Given:  N/A - LOS <3 / Initial given by admissions     Olegario Messier A Cova Knieriem 07/21/2022, 8:44 AM

## 2022-07-21 NOTE — TOC Progression Note (Signed)
Transition of Care [facility]) - Progression Note    Patient Details  Name: Anna Rubio MRN: [id number] Date of Birth: 1940-04-01  [facility]) CM/SW Contact  Marlowe Sax, RN Phone Number: 07/21/2022, 12:03 PM  Clinical Narrative:    Received a massage from Tiffany at [facility] asking to have the daughter Laveda Abbe call her with financial information I notified Laveda Abbe (the patient's daughter) and provided her with tiffany's contact information I submitted documentation to [facility] and am awaiting approval Ref number [id number]   Expected Discharge Plan:  (TBD) Barriers to Discharge: Continued Medical Work up  Expected Discharge Plan and Services   Discharge Planning Services: CM Consult                                           Social Determinants of Health (SDOH) Interventions SDOH Screenings   Tobacco Use: Low Risk  (07/20/2022)    Readmission Risk Interventions     No data to display

## 2022-07-21 NOTE — Progress Notes (Signed)
Still missing dose of depakote sprinkles. Requested from pharmacy.

## 2022-07-22 DIAGNOSIS — F32A Depression, unspecified: Secondary | ICD-10-CM | POA: Diagnosis not present

## 2022-07-22 DIAGNOSIS — F0393 Unspecified dementia, unspecified severity, with mood disturbance: Secondary | ICD-10-CM | POA: Diagnosis not present

## 2022-07-22 DIAGNOSIS — M47812 Spondylosis without myelopathy or radiculopathy, cervical region: Secondary | ICD-10-CM | POA: Diagnosis not present

## 2022-07-22 DIAGNOSIS — E118 Type 2 diabetes mellitus with unspecified complications: Secondary | ICD-10-CM | POA: Diagnosis not present

## 2022-07-22 DIAGNOSIS — F02B18 Dementia in other diseases classified elsewhere, moderate, with other behavioral disturbance: Secondary | ICD-10-CM | POA: Diagnosis not present

## 2022-07-22 DIAGNOSIS — K5904 Chronic idiopathic constipation: Secondary | ICD-10-CM | POA: Diagnosis not present

## 2022-07-22 DIAGNOSIS — D509 Iron deficiency anemia, unspecified: Secondary | ICD-10-CM | POA: Diagnosis not present

## 2022-07-22 DIAGNOSIS — I959 Hypotension, unspecified: Secondary | ICD-10-CM | POA: Diagnosis not present

## 2022-07-22 DIAGNOSIS — S0990XA Unspecified injury of head, initial encounter: Secondary | ICD-10-CM | POA: Diagnosis not present

## 2022-07-22 DIAGNOSIS — K449 Diaphragmatic hernia without obstruction or gangrene: Secondary | ICD-10-CM | POA: Diagnosis not present

## 2022-07-22 DIAGNOSIS — S199XXA Unspecified injury of neck, initial encounter: Secondary | ICD-10-CM | POA: Diagnosis not present

## 2022-07-22 DIAGNOSIS — G9389 Other specified disorders of brain: Secondary | ICD-10-CM | POA: Diagnosis not present

## 2022-07-22 DIAGNOSIS — F331 Major depressive disorder, recurrent, moderate: Secondary | ICD-10-CM | POA: Diagnosis not present

## 2022-07-22 DIAGNOSIS — S0012XA Contusion of left eyelid and periocular area, initial encounter: Secondary | ICD-10-CM | POA: Diagnosis not present

## 2022-07-22 DIAGNOSIS — W19XXXA Unspecified fall, initial encounter: Secondary | ICD-10-CM | POA: Diagnosis not present

## 2022-07-22 DIAGNOSIS — K5730 Diverticulosis of large intestine without perforation or abscess without bleeding: Secondary | ICD-10-CM | POA: Diagnosis not present

## 2022-07-22 DIAGNOSIS — Z7984 Long term (current) use of oral hypoglycemic drugs: Secondary | ICD-10-CM | POA: Diagnosis not present

## 2022-07-22 DIAGNOSIS — R112 Nausea with vomiting, unspecified: Secondary | ICD-10-CM | POA: Diagnosis not present

## 2022-07-22 DIAGNOSIS — G301 Alzheimer's disease with late onset: Secondary | ICD-10-CM | POA: Diagnosis not present

## 2022-07-22 DIAGNOSIS — N390 Urinary tract infection, site not specified: Secondary | ICD-10-CM | POA: Diagnosis not present

## 2022-07-22 DIAGNOSIS — K219 Gastro-esophageal reflux disease without esophagitis: Secondary | ICD-10-CM | POA: Diagnosis not present

## 2022-07-22 DIAGNOSIS — Z7401 Bed confinement status: Secondary | ICD-10-CM | POA: Diagnosis not present

## 2022-07-22 DIAGNOSIS — E43 Unspecified severe protein-calorie malnutrition: Secondary | ICD-10-CM | POA: Diagnosis not present

## 2022-07-22 DIAGNOSIS — K922 Gastrointestinal hemorrhage, unspecified: Secondary | ICD-10-CM | POA: Diagnosis not present

## 2022-07-22 DIAGNOSIS — S0011XA Contusion of right eyelid and periocular area, initial encounter: Secondary | ICD-10-CM | POA: Diagnosis not present

## 2022-07-22 DIAGNOSIS — S0003XA Contusion of scalp, initial encounter: Secondary | ICD-10-CM | POA: Diagnosis not present

## 2022-07-22 DIAGNOSIS — K5900 Constipation, unspecified: Secondary | ICD-10-CM | POA: Diagnosis not present

## 2022-07-22 DIAGNOSIS — I10 Essential (primary) hypertension: Secondary | ICD-10-CM | POA: Diagnosis not present

## 2022-07-22 DIAGNOSIS — E119 Type 2 diabetes mellitus without complications: Secondary | ICD-10-CM | POA: Diagnosis not present

## 2022-07-22 DIAGNOSIS — F0390 Unspecified dementia without behavioral disturbance: Secondary | ICD-10-CM | POA: Diagnosis not present

## 2022-07-22 LAB — GLUCOSE, CAPILLARY
Glucose-Capillary: 115 mg/dL — ABNORMAL HIGH (ref 70–99)
Glucose-Capillary: 189 mg/dL — ABNORMAL HIGH (ref 70–99)
Glucose-Capillary: 95 mg/dL (ref 70–99)

## 2022-07-22 LAB — CULTURE, BLOOD (ROUTINE X 2): Culture: NO GROWTH

## 2022-07-22 MED ORDER — POLYETHYLENE GLYCOL 3350 17 G PO PACK: 17.0000 g | PACK | Freq: Every day | ORAL | 0 refills | Status: DC

## 2022-07-22 MED ORDER — HYDRALAZINE HCL 25 MG PO TABS: 25.0000 mg | ORAL_TABLET | Freq: Four times a day (QID) | ORAL | 0 refills | Status: DC | PRN

## 2022-07-22 MED ORDER — BISACODYL 5 MG PO TBEC: 5.0000 mg | DELAYED_RELEASE_TABLET | Freq: Every day | ORAL | 0 refills | Status: DC | PRN

## 2022-07-22 MED ORDER — FERROUS SULFATE 27 MG PO TABS: 324.0000 mg | ORAL_TABLET | Freq: Every day | ORAL | 0 refills | Status: DC

## 2022-07-22 MED ORDER — ACETAMINOPHEN 325 MG PO TABS: 650.0000 mg | ORAL_TABLET | Freq: Four times a day (QID) | ORAL | 0 refills | Status: DC | PRN

## 2022-07-22 NOTE — TOC Progression Note (Signed)
Transition of Care [facility]) - Progression Note    Patient Details  Name: Anna Rubio MRN: [id number] Date of Birth: 1940/07/04  Transition of Care [facility]) CM/SW Contact  Marlowe Sax, RN Phone Number: 07/22/2022, 12:31 PM  Clinical Narrative:    Will go to room [facility]A at [facility], arranged EMS pick up at 5 PM, will send DC summary once obtained   Expected Discharge Plan:  (TBD) Barriers to Discharge: Continued Medical Work up  Expected Discharge Plan and Services   Discharge Planning Services: CM Consult     Expected Discharge Date: 07/22/22                                     Social Determinants of Health (SDOH) Interventions SDOH Screenings   Tobacco Use: Low Risk  (07/20/2022)    Readmission Risk Interventions     No data to display

## 2022-07-22 NOTE — Progress Notes (Signed)
Physical Therapy Treatment Patient Details Name: Anna Rubio MRN: [id number] DOB: [birth year] Today's Date: 07/22/2022   History of Present Illness Patient is a 82 year old female admitted with vomiting and nausea with UTI. History of dementia,  hypertension, non-insulin-dependent diabetes mellitus, depression, GERD.    PT Comments    Patient is confused but cooperative during session. She continues to require physical assistance for bed mobility. Sitting balance improves with increased sitting time and facilitation for midline posture. Patient does not participate with standing efforts despite cues for task initiation. Recommend to continue PT to maximize independence and decrease caregiver burden. Slow progress overall towards meeting PT goals.    Recommendations for follow up therapy are one component of a multi-disciplinary discharge planning process, led by the attending physician.  Recommendations may be updated based on patient status, additional functional criteria and insurance authorization.  Follow Up Recommendations  Can patient physically be transported by private vehicle: No    Assistance Recommended at Discharge Frequent or constant Supervision/Assistance  Patient can return home with the following Two people to help with walking and/or transfers;A lot of help with bathing/dressing/bathroom;Help with stairs or ramp for entrance;Assist for transportation;Assistance with cooking/housework;Direct supervision/assist for medications management;Direct supervision/assist for financial management   Equipment Recommendations   (to be determined at next level of care)    Recommendations for Other Services       Precautions / Restrictions Precautions Precautions: Fall Restrictions Weight Bearing Restrictions: No     Mobility  Bed Mobility Overal bed mobility: Needs Assistance Bed Mobility: Supine to Sit, Sit to Supine     Supine to sit: Max assist, HOB elevated Sit to  supine: Max assist   General bed mobility comments: assistance for trunk and BLE support. verbal cues for technique    Transfers                   General transfer comment: attempted to faciliate standing with rolling walker. patient does not initiate standing despite cues for task initiation. total assistance for incremental scooting to the right    Ambulation/Gait                   Stairs             Wheelchair Mobility    Modified Rankin (Stroke Patients Only)       Balance Overall balance assessment: Needs assistance Sitting-balance support: Feet supported Sitting balance-Leahy Scale: Poor Sitting balance - Comments: pushing to the right initially with right lateral lean that improved with faciliation for midline and cues for upright/midline posture. patient progressed from poor balance to fair balance with no external support required to maintain midline for ~ 5 minutes. Postural control: Right lateral lean                                  Cognition Arousal/Alertness: Awake/alert Behavior During Therapy: WFL for tasks assessed/performed Overall Cognitive Status: History of cognitive impairments - at baseline                                 General Comments: No family in room to determine baseline cognitive function. Patient has difficulty following single step commands. She responds to her name but unable to answer orientation questions and is minimally verbal. she is cooperative throughout session        Exercises  General Comments        Pertinent Vitals/Pain Pain Assessment Pain Assessment: No/denies pain    Home Living                          Prior Function            PT Goals (current goals can now be found in the care plan section) Acute Rehab PT Goals Patient Stated Goal: patient unable to participate with goal setting, none stated PT Goal Formulation: Patient unable to  participate in goal setting Time For Goal Achievement: 08/02/22 Potential to Achieve Goals: Fair Progress towards PT goals: Progressing toward goals    Frequency    Min 2X/week      PT Plan Current plan remains appropriate    Co-evaluation              AM-PAC PT "6 Clicks" Mobility   Outcome Measure  Help needed turning from your back to your side while in a flat bed without using bedrails?: A Lot Help needed moving from lying on your back to sitting on the side of a flat bed without using bedrails?: A Lot Help needed moving to and from a bed to a chair (including a wheelchair)?: Total Help needed standing up from a chair using your arms (e.g., wheelchair or bedside chair)?: Total Help needed to walk in hospital room?: Total Help needed climbing 3-5 steps with a railing? : Total 6 Click Score: 8    End of Session   Activity Tolerance: Patient tolerated treatment well Patient left: in bed;with call bell/phone within reach;with bed alarm set;with nursing/sitter in room Nurse Communication: Mobility status PT Visit Diagnosis: Muscle weakness (generalized) (M62.81);Other abnormalities of gait and mobility (R26.89)     Time: 1610-9604 PT Time Calculation (min) (ACUTE ONLY): 12 min  Charges:  $Therapeutic Activity: 8-22 mins                     Donna Bernard, PT, MPT    Ina Homes 07/22/2022, 1:13 PM

## 2022-07-22 NOTE — Care Management Important Message (Signed)
Important Message  Patient Details  Name: Anna Rubio MRN: [id number] Date of Birth: [birth year]   Medicare Important Message Given:  Yes  I received a call back from the patient's daughter, Anna Rubio and she is in agreement with the discharge. I thanked her for time.   Olegario Messier A Ercell Razon 07/22/2022, 11:55 AM

## 2022-07-22 NOTE — TOC Progression Note (Signed)
Transition of Care [facility]) - Progression Note    Patient Details  Name: Anna Rubio MRN: [id number] Date of Birth: 22-May-1940  Transition of Care [facility]) CM/SW Contact  Marlowe Sax, RN Phone Number: 07/22/2022, 8:44 AM  Clinical Narrative:    Spoke with the patient's daughter Laveda Abbe, I let her know Ins is approved 6/20-24 to go to [facility], the patient will DC today to [facility], Transport to be set up for this afternoon   Expected Discharge Plan:  (TBD) Barriers to Discharge: Continued Medical Work up  [facility]   Discharge Planning Services: CM Consult                                           Social Determinants of Health (SDOH) Interventions SDOH Screenings   Tobacco Use: Low Risk  (07/20/2022)    Readmission Risk Interventions     No data to display

## 2022-07-22 NOTE — Progress Notes (Signed)
This RN advised to please call patient's daughter, Laveda Abbe when patient is transported to facility. This RN notified Lorie patient has just left the hospital. Lorie verbalized understanding and denies any further questions at this time.

## 2022-07-22 NOTE — Progress Notes (Signed)
Report given to Alexia Freestone, Charity fundraiser at [facility]

## 2022-07-22 NOTE — Care Management Important Message (Signed)
Important Message  Patient Details  Name: Lisia Westbay MRN: [id number] Date of Birth: September 04, 1940   Medicare Important Message Given:  Other (see comment)  Tried reaching the patient's daughter, Katrina Stack [phone] but voice mail is not set up so unable to leave a message. Will try again shortly.   Olegario Messier A Tanaiya Kolarik 07/22/2022, 11:18 AM

## 2022-07-22 NOTE — Plan of Care (Signed)
  Problem: Coping: Goal: Ability to adjust to condition or change in health will improve Outcome: Progressing   Problem: Fluid Volume: Goal: Ability to maintain a balanced intake and output will improve Outcome: Progressing   Problem: Nutritional: Goal: Maintenance of adequate nutrition will improve Outcome: Progressing Goal: Progress toward achieving an optimal weight will improve Outcome: Progressing   Problem: Clinical Measurements: Goal: Will remain free from infection Outcome: Progressing   Problem: Activity: Goal: Risk for activity intolerance will decrease Outcome: Progressing   Problem: Safety: Goal: Ability to remain free from injury will improve Outcome: Progressing

## 2022-07-22 NOTE — Discharge Summary (Signed)
Physician Discharge Summary   Patient: Anna Rubio MRN: [id number] DOB: [birth year]  Admit date:     07/17/2022  Discharge date: 07/22/22  Discharge Physician: Loyce Dys   PCP: Gracelyn Nurse, MD    Discharge Diagnoses: Intractable nausea and vomiting-resolved Hematemesis-resolved Protein-calorie malnutrition, severe Constipation UTI (urinary tract infection) ruled out [facility]) Essential hypertension Depression Diabetes mellitus type 2, noninsulin dependent [facility])  Hospital Course: Ms. Anna Rubio is a 82 year old female with history of dementia, hypertension, non-insulin-dependent diabetes mellitus, depression, GERD, who presented to the ED on 07/17/22 from [facility] for evaluation of dark bilious, green emesis.  Per family, facility reports that patient vomited approximately 3 times on day of presentation. Evaluation in the ED was consistent with UTI with UA was positive for large leukocytes. CT abdomen/pelvis was negative for acute findings but did show diverticulosis without signs of diverticulitis, and also large volume stool throughout the colon including a 7.5 cm stool ball in the rectum. Pt was started on IV antibiotics for UTI pending cultures, IV Protonix due to concern for possible upper GI bleeding.  GI is consulted.  EGD was done that was unrevealing for any active bleeding.  Patient's hemoglobin stabilized and has been cleared by GI.  Patient is therefore being discharged today to skilled nursing facility.    Consultants: GI Procedures performed: EGD Disposition: Skilled nursing facility Diet recommendation:  Discharge Diet Orders (From admission, onward)     Start     Ordered   07/22/22 0000  Diet - low sodium heart healthy        07/22/22 1057           Cardiac diet DISCHARGE MEDICATION: Allergies as of 07/22/2022       Reactions   Aspirin Hives   Penicillins Hives        Medication List     STOP taking these medications     ferrous sulfate 324 MG Tbec Replaced by: Ferrous Sulfate 27 MG Tabs   loratadine 10 MG tablet Commonly known as: CLARITIN   meloxicam 7.5 MG tablet Commonly known as: MOBIC       TAKE these medications    acetaminophen 325 MG tablet Commonly known as: TYLENOL Take 2 tablets (650 mg total) by mouth every 6 (six) hours as needed for mild pain, fever or headache (or Fever >/= 101).   atenolol 25 MG tablet Commonly known as: TENORMIN Take 25 mg by mouth daily.   bisacodyl 5 MG EC tablet Commonly known as: DULCOLAX Take 1 tablet (5 mg total) by mouth daily as needed for moderate constipation.   cholecalciferol 25 MCG (1000 UNIT) tablet Commonly known as: VITAMIN D3 Take 1,000 Units by mouth daily.   divalproex 125 MG capsule Commonly known as: DEPAKOTE SPRINKLE Take 125 mg by mouth 2 (two) times daily.   Ferrous Sulfate 27 MG Tabs Take 12 tablets (324 mg total) by mouth daily with breakfast. Start taking on: July 23, 2022 Replaces: ferrous sulfate 324 MG Tbec   hydrALAZINE 25 MG tablet Commonly known as: APRESOLINE Take 1 tablet (25 mg total) by mouth every 6 (six) hours as needed (SBP>160 despite scheduled meds).   lisinopril 10 MG tablet Commonly known as: ZESTRIL Take 10 mg by mouth daily.   magnesium oxide 400 MG tablet Commonly known as: MAG-OX Take 400 mg by mouth daily.   Melatonin 10 MG Tabs Take 10 mg by mouth at bedtime.   metFORMIN 1000 MG tablet Commonly known as: GLUCOPHAGE Take  1,000 mg by mouth daily.   pantoprazole 40 MG tablet Commonly known as: PROTONIX Take 40 mg by mouth daily.   polyethylene glycol 17 g packet Commonly known as: MIRALAX / GLYCOLAX Take 17 g by mouth daily. Start taking on: July 23, 2022   sertraline 50 MG tablet Commonly known as: ZOLOFT Take 50 mg by mouth daily.        Contact information for after-discharge care     Destination     [facility] Preferred SNF .   Service: Skilled Nursing Contact information: [address] [place] [address] [phone]                    Discharge Exam: Ceasar Mons Weights   07/17/22 1007 07/19/22 1105  Weight: 43.1 kg 43.1 kg   General exam: Alert and awake laying in bed in no distress nonverbal HEENT: moist mucus membranes, hearing grossly normal  Respiratory system: Clear to auscultation bilaterally no wheezing Cardiovascular system: Normal rate and rhythm Gastrointestinal system: No abdominal tenderness Central nervous system: limited exam due to dementia, no gross focal neurologic deficits, non-verbal Extremities: moves all, no edema, normal tone Skin: dry, intact, normal temperature Psychiatry: normal mood, congruent affect, judgement and insight appear normal  Condition at discharge: good  Discharge time spent: 33 minutes  Signed: Loyce Dys, MD [facility] 07/22/2022

## 2022-07-25 DIAGNOSIS — R112 Nausea with vomiting, unspecified: Secondary | ICD-10-CM | POA: Diagnosis not present

## 2022-07-25 DIAGNOSIS — F02B18 Dementia in other diseases classified elsewhere, moderate, with other behavioral disturbance: Secondary | ICD-10-CM | POA: Diagnosis not present

## 2022-07-25 DIAGNOSIS — K5900 Constipation, unspecified: Secondary | ICD-10-CM | POA: Diagnosis not present

## 2022-07-25 DIAGNOSIS — G301 Alzheimer's disease with late onset: Secondary | ICD-10-CM | POA: Diagnosis not present

## 2022-07-25 DIAGNOSIS — I10 Essential (primary) hypertension: Secondary | ICD-10-CM | POA: Diagnosis not present

## 2022-07-25 DIAGNOSIS — E118 Type 2 diabetes mellitus with unspecified complications: Secondary | ICD-10-CM | POA: Diagnosis not present

## 2022-07-25 DIAGNOSIS — D509 Iron deficiency anemia, unspecified: Secondary | ICD-10-CM | POA: Diagnosis not present

## 2022-07-25 DIAGNOSIS — K219 Gastro-esophageal reflux disease without esophagitis: Secondary | ICD-10-CM | POA: Diagnosis not present

## 2022-07-25 DIAGNOSIS — F331 Major depressive disorder, recurrent, moderate: Secondary | ICD-10-CM | POA: Diagnosis not present

## 2022-07-26 DIAGNOSIS — K5904 Chronic idiopathic constipation: Secondary | ICD-10-CM | POA: Diagnosis not present

## 2022-07-26 DIAGNOSIS — E118 Type 2 diabetes mellitus with unspecified complications: Secondary | ICD-10-CM | POA: Diagnosis not present

## 2022-07-26 DIAGNOSIS — F331 Major depressive disorder, recurrent, moderate: Secondary | ICD-10-CM | POA: Diagnosis not present

## 2022-07-26 DIAGNOSIS — R112 Nausea with vomiting, unspecified: Secondary | ICD-10-CM | POA: Diagnosis not present

## 2022-07-26 DIAGNOSIS — I10 Essential (primary) hypertension: Secondary | ICD-10-CM | POA: Diagnosis not present

## 2022-07-26 DIAGNOSIS — K922 Gastrointestinal hemorrhage, unspecified: Secondary | ICD-10-CM | POA: Diagnosis not present

## 2022-07-26 DIAGNOSIS — N390 Urinary tract infection, site not specified: Secondary | ICD-10-CM | POA: Diagnosis not present

## 2022-07-26 DIAGNOSIS — K219 Gastro-esophageal reflux disease without esophagitis: Secondary | ICD-10-CM | POA: Diagnosis not present

## 2022-07-26 DIAGNOSIS — G301 Alzheimer's disease with late onset: Secondary | ICD-10-CM | POA: Diagnosis not present

## 2022-07-27 DIAGNOSIS — R112 Nausea with vomiting, unspecified: Secondary | ICD-10-CM | POA: Diagnosis not present

## 2022-07-27 DIAGNOSIS — E118 Type 2 diabetes mellitus with unspecified complications: Secondary | ICD-10-CM | POA: Diagnosis not present

## 2022-07-27 DIAGNOSIS — G301 Alzheimer's disease with late onset: Secondary | ICD-10-CM | POA: Diagnosis not present

## 2022-07-27 DIAGNOSIS — K5730 Diverticulosis of large intestine without perforation or abscess without bleeding: Secondary | ICD-10-CM | POA: Diagnosis not present

## 2022-07-27 DIAGNOSIS — F331 Major depressive disorder, recurrent, moderate: Secondary | ICD-10-CM | POA: Diagnosis not present

## 2022-07-27 DIAGNOSIS — F02B18 Dementia in other diseases classified elsewhere, moderate, with other behavioral disturbance: Secondary | ICD-10-CM | POA: Diagnosis not present

## 2022-07-27 DIAGNOSIS — I10 Essential (primary) hypertension: Secondary | ICD-10-CM | POA: Diagnosis not present

## 2022-07-29 DIAGNOSIS — K5730 Diverticulosis of large intestine without perforation or abscess without bleeding: Secondary | ICD-10-CM | POA: Diagnosis not present

## 2022-07-29 DIAGNOSIS — K5904 Chronic idiopathic constipation: Secondary | ICD-10-CM | POA: Diagnosis not present

## 2022-07-29 DIAGNOSIS — I10 Essential (primary) hypertension: Secondary | ICD-10-CM | POA: Diagnosis not present

## 2022-07-29 DIAGNOSIS — F331 Major depressive disorder, recurrent, moderate: Secondary | ICD-10-CM | POA: Diagnosis not present

## 2022-07-29 DIAGNOSIS — F02B18 Dementia in other diseases classified elsewhere, moderate, with other behavioral disturbance: Secondary | ICD-10-CM | POA: Diagnosis not present

## 2022-07-29 DIAGNOSIS — K922 Gastrointestinal hemorrhage, unspecified: Secondary | ICD-10-CM | POA: Diagnosis not present

## 2022-07-29 DIAGNOSIS — E118 Type 2 diabetes mellitus with unspecified complications: Secondary | ICD-10-CM | POA: Diagnosis not present

## 2022-07-29 DIAGNOSIS — R112 Nausea with vomiting, unspecified: Secondary | ICD-10-CM | POA: Diagnosis not present

## 2022-07-29 DIAGNOSIS — G301 Alzheimer's disease with late onset: Secondary | ICD-10-CM | POA: Diagnosis not present

## 2022-08-01 ENCOUNTER — Emergency Department: Payer: Medicare HMO

## 2022-08-01 ENCOUNTER — Other Ambulatory Visit: Payer: Self-pay

## 2022-08-01 ENCOUNTER — Emergency Department
Admission: EM | Admit: 2022-08-01 | Discharge: 2022-08-01 | Disposition: A | Discharge status: Home / Self Care | Payer: Medicare HMO | Attending: Emergency Medicine | Admitting: Emergency Medicine

## 2022-08-01 DIAGNOSIS — M47812 Spondylosis without myelopathy or radiculopathy, cervical region: Secondary | ICD-10-CM | POA: Diagnosis not present

## 2022-08-01 DIAGNOSIS — W19XXXA Unspecified fall, initial encounter: Secondary | ICD-10-CM

## 2022-08-01 DIAGNOSIS — F0390 Unspecified dementia without behavioral disturbance: Secondary | ICD-10-CM | POA: Insufficient documentation

## 2022-08-01 DIAGNOSIS — S0003XA Contusion of scalp, initial encounter: Secondary | ICD-10-CM | POA: Diagnosis not present

## 2022-08-01 DIAGNOSIS — S0990XA Unspecified injury of head, initial encounter: Secondary | ICD-10-CM | POA: Diagnosis not present

## 2022-08-01 DIAGNOSIS — S199XXA Unspecified injury of neck, initial encounter: Secondary | ICD-10-CM | POA: Diagnosis not present

## 2022-08-01 DIAGNOSIS — G9389 Other specified disorders of brain: Secondary | ICD-10-CM | POA: Diagnosis not present

## 2022-08-01 DIAGNOSIS — I959 Hypotension, unspecified: Secondary | ICD-10-CM | POA: Diagnosis not present

## 2022-08-01 HISTORY — DX: Unspecified dementia, unspecified severity, without behavioral disturbance, psychotic disturbance, mood disturbance, and anxiety: F03.90

## 2022-08-01 NOTE — ED Notes (Signed)
Pt daughter is taking pt back to [facility] via Private vehicle.

## 2022-08-01 NOTE — ED Triage Notes (Signed)
Pt to ED [facility] from [facility] for unwitnessed fall from wheelchair to floor Pt has baseline dementia Bruising above R eyebrow 90/50, uncooperative with EMS VS, arms contracted. HR 60, PO2 92%  Daughter at bedside and crying

## 2022-08-01 NOTE — ED Provider Notes (Signed)
   [facility] Provider Note    Event Date/Time   First MD Initiated Contact with Patient 08/01/22 1718     (approximate)   History   Fall   HPI  Anna Rubio is a 82 y.o. female with history of dementia who presents after a fall.  Patient is wheelchair-bound, apparently fell forward out of her wheelchair and hit her head.  Is a poor historian, daughter is here who describes the incident as she was told.  Not on blood thinners     Physical Exam   Triage Vital Signs: ED Triage Vitals  Enc Vitals Group     BP 08/01/22 1716 (!) 170/73     Pulse Rate 08/01/22 1716 (!) 54     Resp 08/01/22 1716 16     Temp 08/01/22 1716 97.6 F (36.4 C)     Temp Source 08/01/22 1716 Axillary     SpO2 08/01/22 1716 95 %     Weight 08/01/22 1713 45.4 kg (100 lb)     Height 08/01/22 1713 1.651 m (5\' 5" )     Head Circumference --      Peak Flow --      Pain Score --      Pain Loc --      Pain Edu? --      Excl. in GC? --     Most recent vital signs: Vitals:   08/01/22 1716  BP: (!) 170/73  Pulse: (!) 54  Resp: 16  Temp: 97.6 F (36.4 C)  SpO2: 95%     General: Awake, no distress.  CV:  Good peripheral perfusion.  Resp:  Normal effort.  Abd:  No distention.  Other:  Patient with hematoma to the forehead, no clear vertebral tenderness to the cervical spine.  No vertebral tenderness to thoracic or lumbar spine.  No pain with axial load on both hips.  No tenderness to patient of the upper extremities or chest wall or abdomen   ED Results / Procedures / Treatments   Labs (all labs ordered are listed, but only abnormal results are displayed) Labs Reviewed - No data to display   EKG     RADIOLOGY CT head and cervical spine    PROCEDURES:  Critical Care performed:   Procedures   MEDICATIONS ORDERED IN ED: Medications - No data to display   IMPRESSION / MDM / ASSESSMENT AND PLAN / ED COURSE  I reviewed the triage vital signs and the  nursing notes. Patient's presentation is most consistent with acute presentation with potential threat to life or bodily function.  Patient presents after head injury as detailed above, large hematoma to the forehead, differential includes hematoma, skull fracture, ICH  Sent for CT head and cervical spine and reevaluate.  CT scans are reassuring, no indication for admission, appropriate for discharge.      FINAL CLINICAL IMPRESSION(S) / ED DIAGNOSES   Final diagnoses:  Fall, initial encounter  Injury of head, initial encounter     Rx / DC Orders   ED Discharge Orders     None        Note:  This document was prepared using Dragon voice recognition software and may include unintentional dictation errors.   Jene Every, MD 08/01/22 [phone]

## 2022-08-03 DIAGNOSIS — S0012XA Contusion of left eyelid and periocular area, initial encounter: Secondary | ICD-10-CM | POA: Diagnosis not present

## 2022-08-03 DIAGNOSIS — K5730 Diverticulosis of large intestine without perforation or abscess without bleeding: Secondary | ICD-10-CM | POA: Diagnosis not present

## 2022-08-03 DIAGNOSIS — G301 Alzheimer's disease with late onset: Secondary | ICD-10-CM | POA: Diagnosis not present

## 2022-08-03 DIAGNOSIS — W19XXXA Unspecified fall, initial encounter: Secondary | ICD-10-CM | POA: Diagnosis not present

## 2022-08-03 DIAGNOSIS — S0011XA Contusion of right eyelid and periocular area, initial encounter: Secondary | ICD-10-CM | POA: Diagnosis not present

## 2022-08-03 DIAGNOSIS — E118 Type 2 diabetes mellitus with unspecified complications: Secondary | ICD-10-CM | POA: Diagnosis not present

## 2022-08-03 DIAGNOSIS — R112 Nausea with vomiting, unspecified: Secondary | ICD-10-CM | POA: Diagnosis not present

## 2022-08-03 DIAGNOSIS — I10 Essential (primary) hypertension: Secondary | ICD-10-CM | POA: Diagnosis not present

## 2022-08-03 DIAGNOSIS — F02B18 Dementia in other diseases classified elsewhere, moderate, with other behavioral disturbance: Secondary | ICD-10-CM | POA: Diagnosis not present

## 2022-08-08 DIAGNOSIS — S0011XD Contusion of right eyelid and periocular area, subsequent encounter: Secondary | ICD-10-CM | POA: Diagnosis not present

## 2022-08-08 DIAGNOSIS — G301 Alzheimer's disease with late onset: Secondary | ICD-10-CM | POA: Diagnosis not present

## 2022-08-08 DIAGNOSIS — K219 Gastro-esophageal reflux disease without esophagitis: Secondary | ICD-10-CM | POA: Diagnosis not present

## 2022-08-08 DIAGNOSIS — K5904 Chronic idiopathic constipation: Secondary | ICD-10-CM | POA: Diagnosis not present

## 2022-08-08 DIAGNOSIS — I10 Essential (primary) hypertension: Secondary | ICD-10-CM | POA: Diagnosis not present

## 2022-08-08 DIAGNOSIS — S0012XD Contusion of left eyelid and periocular area, subsequent encounter: Secondary | ICD-10-CM | POA: Diagnosis not present

## 2022-08-08 DIAGNOSIS — F02B18 Dementia in other diseases classified elsewhere, moderate, with other behavioral disturbance: Secondary | ICD-10-CM | POA: Diagnosis not present

## 2022-08-08 DIAGNOSIS — E118 Type 2 diabetes mellitus with unspecified complications: Secondary | ICD-10-CM | POA: Diagnosis not present

## 2022-08-08 DIAGNOSIS — W19XXXD Unspecified fall, subsequent encounter: Secondary | ICD-10-CM | POA: Diagnosis not present

## 2022-08-10 DIAGNOSIS — S0012XD Contusion of left eyelid and periocular area, subsequent encounter: Secondary | ICD-10-CM | POA: Diagnosis not present

## 2022-08-10 DIAGNOSIS — S0011XD Contusion of right eyelid and periocular area, subsequent encounter: Secondary | ICD-10-CM | POA: Diagnosis not present

## 2022-08-10 DIAGNOSIS — F02B18 Dementia in other diseases classified elsewhere, moderate, with other behavioral disturbance: Secondary | ICD-10-CM | POA: Diagnosis not present

## 2022-08-10 DIAGNOSIS — W19XXXD Unspecified fall, subsequent encounter: Secondary | ICD-10-CM | POA: Diagnosis not present

## 2022-08-10 DIAGNOSIS — K219 Gastro-esophageal reflux disease without esophagitis: Secondary | ICD-10-CM | POA: Diagnosis not present

## 2022-08-10 DIAGNOSIS — E118 Type 2 diabetes mellitus with unspecified complications: Secondary | ICD-10-CM | POA: Diagnosis not present

## 2022-08-10 DIAGNOSIS — I10 Essential (primary) hypertension: Secondary | ICD-10-CM | POA: Diagnosis not present

## 2022-08-10 DIAGNOSIS — K5904 Chronic idiopathic constipation: Secondary | ICD-10-CM | POA: Diagnosis not present

## 2022-08-10 DIAGNOSIS — G301 Alzheimer's disease with late onset: Secondary | ICD-10-CM | POA: Diagnosis not present

## 2022-08-16 DIAGNOSIS — F331 Major depressive disorder, recurrent, moderate: Secondary | ICD-10-CM | POA: Diagnosis not present

## 2022-08-19 DIAGNOSIS — S0011XD Contusion of right eyelid and periocular area, subsequent encounter: Secondary | ICD-10-CM | POA: Diagnosis not present

## 2022-08-19 DIAGNOSIS — G301 Alzheimer's disease with late onset: Secondary | ICD-10-CM | POA: Diagnosis not present

## 2022-08-19 DIAGNOSIS — E118 Type 2 diabetes mellitus with unspecified complications: Secondary | ICD-10-CM | POA: Diagnosis not present

## 2022-08-19 DIAGNOSIS — F02B18 Dementia in other diseases classified elsewhere, moderate, with other behavioral disturbance: Secondary | ICD-10-CM | POA: Diagnosis not present

## 2022-08-19 DIAGNOSIS — D509 Iron deficiency anemia, unspecified: Secondary | ICD-10-CM | POA: Diagnosis not present

## 2022-08-19 DIAGNOSIS — W19XXXD Unspecified fall, subsequent encounter: Secondary | ICD-10-CM | POA: Diagnosis not present

## 2022-08-19 DIAGNOSIS — S0012XD Contusion of left eyelid and periocular area, subsequent encounter: Secondary | ICD-10-CM | POA: Diagnosis not present

## 2022-08-19 DIAGNOSIS — K5904 Chronic idiopathic constipation: Secondary | ICD-10-CM | POA: Diagnosis not present

## 2022-08-24 DIAGNOSIS — E43 Unspecified severe protein-calorie malnutrition: Secondary | ICD-10-CM | POA: Diagnosis not present

## 2022-08-24 DIAGNOSIS — L89516 Pressure-induced deep tissue damage of right ankle: Secondary | ICD-10-CM | POA: Diagnosis not present

## 2022-08-24 DIAGNOSIS — E119 Type 2 diabetes mellitus without complications: Secondary | ICD-10-CM | POA: Diagnosis not present

## 2022-08-26 DIAGNOSIS — F22 Delusional disorders: Secondary | ICD-10-CM | POA: Diagnosis not present

## 2022-08-26 DIAGNOSIS — E1159 Type 2 diabetes mellitus with other circulatory complications: Secondary | ICD-10-CM | POA: Diagnosis not present

## 2022-08-26 DIAGNOSIS — K5904 Chronic idiopathic constipation: Secondary | ICD-10-CM | POA: Diagnosis not present

## 2022-08-26 DIAGNOSIS — I10 Essential (primary) hypertension: Secondary | ICD-10-CM | POA: Diagnosis not present

## 2022-08-26 DIAGNOSIS — D509 Iron deficiency anemia, unspecified: Secondary | ICD-10-CM | POA: Diagnosis not present

## 2022-08-26 DIAGNOSIS — E118 Type 2 diabetes mellitus with unspecified complications: Secondary | ICD-10-CM | POA: Diagnosis not present

## 2022-08-26 DIAGNOSIS — K219 Gastro-esophageal reflux disease without esophagitis: Secondary | ICD-10-CM | POA: Diagnosis not present

## 2022-08-29 DIAGNOSIS — E118 Type 2 diabetes mellitus with unspecified complications: Secondary | ICD-10-CM | POA: Diagnosis not present

## 2022-08-29 DIAGNOSIS — L89510 Pressure ulcer of right ankle, unstageable: Secondary | ICD-10-CM | POA: Diagnosis not present

## 2022-08-29 DIAGNOSIS — F02B18 Dementia in other diseases classified elsewhere, moderate, with other behavioral disturbance: Secondary | ICD-10-CM | POA: Diagnosis not present

## 2022-08-29 DIAGNOSIS — D509 Iron deficiency anemia, unspecified: Secondary | ICD-10-CM | POA: Diagnosis not present

## 2022-08-29 DIAGNOSIS — G301 Alzheimer's disease with late onset: Secondary | ICD-10-CM | POA: Diagnosis not present

## 2022-08-31 DIAGNOSIS — E119 Type 2 diabetes mellitus without complications: Secondary | ICD-10-CM | POA: Diagnosis not present

## 2022-08-31 DIAGNOSIS — L89516 Pressure-induced deep tissue damage of right ankle: Secondary | ICD-10-CM | POA: Diagnosis not present

## 2022-08-31 DIAGNOSIS — E43 Unspecified severe protein-calorie malnutrition: Secondary | ICD-10-CM | POA: Diagnosis not present

## 2022-09-01 DIAGNOSIS — E119 Type 2 diabetes mellitus without complications: Secondary | ICD-10-CM | POA: Diagnosis not present

## 2022-09-07 DIAGNOSIS — L89153 Pressure ulcer of sacral region, stage 3: Secondary | ICD-10-CM | POA: Diagnosis not present

## 2022-09-07 DIAGNOSIS — E43 Unspecified severe protein-calorie malnutrition: Secondary | ICD-10-CM | POA: Diagnosis not present

## 2022-09-07 DIAGNOSIS — L89514 Pressure ulcer of right ankle, stage 4: Secondary | ICD-10-CM | POA: Diagnosis not present

## 2022-09-07 DIAGNOSIS — E119 Type 2 diabetes mellitus without complications: Secondary | ICD-10-CM | POA: Diagnosis not present

## 2022-09-09 DIAGNOSIS — F331 Major depressive disorder, recurrent, moderate: Secondary | ICD-10-CM | POA: Diagnosis not present

## 2022-09-14 DIAGNOSIS — E43 Unspecified severe protein-calorie malnutrition: Secondary | ICD-10-CM | POA: Diagnosis not present

## 2022-09-14 DIAGNOSIS — E119 Type 2 diabetes mellitus without complications: Secondary | ICD-10-CM | POA: Diagnosis not present

## 2022-09-14 DIAGNOSIS — L89514 Pressure ulcer of right ankle, stage 4: Secondary | ICD-10-CM | POA: Diagnosis not present

## 2022-09-20 DIAGNOSIS — K5904 Chronic idiopathic constipation: Secondary | ICD-10-CM | POA: Diagnosis not present

## 2022-09-20 DIAGNOSIS — E118 Type 2 diabetes mellitus with unspecified complications: Secondary | ICD-10-CM | POA: Diagnosis not present

## 2022-09-20 DIAGNOSIS — K219 Gastro-esophageal reflux disease without esophagitis: Secondary | ICD-10-CM | POA: Diagnosis not present

## 2022-09-20 DIAGNOSIS — D509 Iron deficiency anemia, unspecified: Secondary | ICD-10-CM | POA: Diagnosis not present

## 2022-09-20 DIAGNOSIS — I10 Essential (primary) hypertension: Secondary | ICD-10-CM | POA: Diagnosis not present

## 2022-09-21 DIAGNOSIS — E119 Type 2 diabetes mellitus without complications: Secondary | ICD-10-CM | POA: Diagnosis not present

## 2022-09-21 DIAGNOSIS — D509 Iron deficiency anemia, unspecified: Secondary | ICD-10-CM | POA: Diagnosis not present

## 2022-09-21 DIAGNOSIS — Z79899 Other long term (current) drug therapy: Secondary | ICD-10-CM | POA: Diagnosis not present

## 2022-09-21 DIAGNOSIS — L89153 Pressure ulcer of sacral region, stage 3: Secondary | ICD-10-CM | POA: Diagnosis not present

## 2022-09-21 DIAGNOSIS — E43 Unspecified severe protein-calorie malnutrition: Secondary | ICD-10-CM | POA: Diagnosis not present

## 2022-09-22 DIAGNOSIS — D509 Iron deficiency anemia, unspecified: Secondary | ICD-10-CM | POA: Diagnosis not present

## 2022-09-28 DIAGNOSIS — E1151 Type 2 diabetes mellitus with diabetic peripheral angiopathy without gangrene: Secondary | ICD-10-CM | POA: Diagnosis not present

## 2022-09-28 DIAGNOSIS — F331 Major depressive disorder, recurrent, moderate: Secondary | ICD-10-CM | POA: Diagnosis not present

## 2022-09-28 DIAGNOSIS — L603 Nail dystrophy: Secondary | ICD-10-CM | POA: Diagnosis not present

## 2022-10-05 DIAGNOSIS — L89153 Pressure ulcer of sacral region, stage 3: Secondary | ICD-10-CM | POA: Diagnosis not present

## 2022-10-05 DIAGNOSIS — L89514 Pressure ulcer of right ankle, stage 4: Secondary | ICD-10-CM | POA: Diagnosis not present

## 2022-10-05 DIAGNOSIS — L89213 Pressure ulcer of right hip, stage 3: Secondary | ICD-10-CM | POA: Diagnosis not present

## 2022-10-05 DIAGNOSIS — E43 Unspecified severe protein-calorie malnutrition: Secondary | ICD-10-CM | POA: Diagnosis not present

## 2022-10-05 DIAGNOSIS — E119 Type 2 diabetes mellitus without complications: Secondary | ICD-10-CM | POA: Diagnosis not present

## 2022-10-12 DIAGNOSIS — F331 Major depressive disorder, recurrent, moderate: Secondary | ICD-10-CM | POA: Diagnosis not present

## 2022-10-12 DIAGNOSIS — E119 Type 2 diabetes mellitus without complications: Secondary | ICD-10-CM | POA: Diagnosis not present

## 2022-10-12 DIAGNOSIS — E43 Unspecified severe protein-calorie malnutrition: Secondary | ICD-10-CM | POA: Diagnosis not present

## 2022-10-12 DIAGNOSIS — L89213 Pressure ulcer of right hip, stage 3: Secondary | ICD-10-CM | POA: Diagnosis not present

## 2022-10-12 DIAGNOSIS — L89153 Pressure ulcer of sacral region, stage 3: Secondary | ICD-10-CM | POA: Diagnosis not present

## 2022-10-19 DIAGNOSIS — L89136 Pressure-induced deep tissue damage of right lower back: Secondary | ICD-10-CM | POA: Diagnosis not present

## 2022-10-19 DIAGNOSIS — E43 Unspecified severe protein-calorie malnutrition: Secondary | ICD-10-CM | POA: Diagnosis not present

## 2022-10-19 DIAGNOSIS — E119 Type 2 diabetes mellitus without complications: Secondary | ICD-10-CM | POA: Diagnosis not present

## 2022-10-26 DIAGNOSIS — L89214 Pressure ulcer of right hip, stage 4: Secondary | ICD-10-CM | POA: Diagnosis not present

## 2022-10-26 DIAGNOSIS — L89514 Pressure ulcer of right ankle, stage 4: Secondary | ICD-10-CM | POA: Diagnosis not present

## 2022-10-26 DIAGNOSIS — E119 Type 2 diabetes mellitus without complications: Secondary | ICD-10-CM | POA: Diagnosis not present

## 2022-10-26 DIAGNOSIS — E43 Unspecified severe protein-calorie malnutrition: Secondary | ICD-10-CM | POA: Diagnosis not present

## 2022-11-02 DIAGNOSIS — L89153 Pressure ulcer of sacral region, stage 3: Secondary | ICD-10-CM | POA: Diagnosis not present

## 2022-11-02 DIAGNOSIS — E119 Type 2 diabetes mellitus without complications: Secondary | ICD-10-CM | POA: Diagnosis not present

## 2022-11-02 DIAGNOSIS — E43 Unspecified severe protein-calorie malnutrition: Secondary | ICD-10-CM | POA: Diagnosis not present

## 2022-11-09 DIAGNOSIS — L89214 Pressure ulcer of right hip, stage 4: Secondary | ICD-10-CM | POA: Diagnosis not present

## 2022-11-09 DIAGNOSIS — L89514 Pressure ulcer of right ankle, stage 4: Secondary | ICD-10-CM | POA: Diagnosis not present

## 2022-11-09 DIAGNOSIS — E43 Unspecified severe protein-calorie malnutrition: Secondary | ICD-10-CM | POA: Diagnosis not present

## 2022-11-09 DIAGNOSIS — E119 Type 2 diabetes mellitus without complications: Secondary | ICD-10-CM | POA: Diagnosis not present

## 2022-11-09 DIAGNOSIS — F331 Major depressive disorder, recurrent, moderate: Secondary | ICD-10-CM | POA: Diagnosis not present

## 2022-11-16 DIAGNOSIS — L89214 Pressure ulcer of right hip, stage 4: Secondary | ICD-10-CM | POA: Diagnosis not present

## 2022-11-16 DIAGNOSIS — E43 Unspecified severe protein-calorie malnutrition: Secondary | ICD-10-CM | POA: Diagnosis not present

## 2022-11-16 DIAGNOSIS — E119 Type 2 diabetes mellitus without complications: Secondary | ICD-10-CM | POA: Diagnosis not present

## 2022-11-29 DIAGNOSIS — F331 Major depressive disorder, recurrent, moderate: Secondary | ICD-10-CM | POA: Diagnosis not present

## 2023-05-02 DEATH — deceased
